# Patient Record
Sex: Male | Born: 1945 | Race: White | Hispanic: No | Marital: Married | Smoking: Former smoker
Health system: Southern US, Community
[De-identification: ages and names within clinical notes are randomized; demographics above are authoritative.]

## PROBLEM LIST (undated history)

## (undated) DIAGNOSIS — R42 Dizziness and giddiness: Secondary | ICD-10-CM

## (undated) DIAGNOSIS — G4730 Sleep apnea, unspecified: Secondary | ICD-10-CM

## (undated) DIAGNOSIS — K51919 Ulcerative colitis, unspecified with unspecified complications: Secondary | ICD-10-CM

## (undated) DIAGNOSIS — K529 Noninfective gastroenteritis and colitis, unspecified: Secondary | ICD-10-CM

## (undated) DIAGNOSIS — H9190 Unspecified hearing loss, unspecified ear: Secondary | ICD-10-CM

## (undated) DIAGNOSIS — K51019 Ulcerative (chronic) pancolitis with unspecified complications: Secondary | ICD-10-CM

## (undated) DIAGNOSIS — M1990 Unspecified osteoarthritis, unspecified site: Secondary | ICD-10-CM

## (undated) DIAGNOSIS — L409 Psoriasis, unspecified: Secondary | ICD-10-CM

## (undated) DIAGNOSIS — I639 Cerebral infarction, unspecified: Secondary | ICD-10-CM

## (undated) HISTORY — PX: TONSILLECTOMY: SUR1361

## (undated) HISTORY — PX: UVULOPALATOPHARYNGOPLASTY: SHX827

---

## 2015-04-27 ENCOUNTER — Telehealth: Payer: Self-pay | Admitting: Family Medicine

## 2015-04-27 NOTE — Telephone Encounter (Signed)
[place], wanted to know if will take him back as a pt

## 2015-06-03 ENCOUNTER — Telehealth: Payer: Self-pay | Admitting: Family Medicine

## 2015-06-03 NOTE — Telephone Encounter (Signed)
See below

## 2015-06-03 NOTE — Telephone Encounter (Signed)
Pt stated that he was a pt of Dr. Alben Spittle years ago and he moved away and has now moved back. Pt would like to be reestablished with Dr. Rosanna Randy and would like to come in for a CPE. Insurance: Nurse, children's. Medical conditions: Arthritis & Colitis. Current Medications: Lialda & Methotrexate. Pt was seeing Dr. Ascencion Dike while pt was living in [place] last CPE was March 2015. Can you reestablish? If yes, when can I get pt on the schedule and does pt need a new pt appt then a CPE? Thanks TNP

## 2015-06-06 NOTE — Telephone Encounter (Signed)
Please call patient to schedule the appointment

## 2015-06-06 NOTE — Telephone Encounter (Signed)
I can see him next week to get reestablished,will sign for old records then and arrange Wellness.

## 2015-06-07 NOTE — Telephone Encounter (Signed)
I called pt to offer 3:00pm on 06/15/15. Pt stated that once he gets back to his office he will check his schedule and call back. Thanks TNP

## 2015-06-07 NOTE — Telephone Encounter (Signed)
Pt called back and stated that he was very glad that Dr. Rosanna Randy was going to work him in but he was able to get an appt somewhere else. Thanks TNP

## 2015-07-06 DIAGNOSIS — E7800 Pure hypercholesterolemia, unspecified: Secondary | ICD-10-CM | POA: Insufficient documentation

## 2015-07-06 DIAGNOSIS — M0579 Rheumatoid arthritis with rheumatoid factor of multiple sites without organ or systems involvement: Secondary | ICD-10-CM | POA: Insufficient documentation

## 2015-12-28 DIAGNOSIS — H2513 Age-related nuclear cataract, bilateral: Secondary | ICD-10-CM | POA: Diagnosis not present

## 2015-12-28 DIAGNOSIS — H5501 Congenital nystagmus: Secondary | ICD-10-CM | POA: Diagnosis not present

## 2016-01-16 DIAGNOSIS — M0579 Rheumatoid arthritis with rheumatoid factor of multiple sites without organ or systems involvement: Secondary | ICD-10-CM | POA: Diagnosis not present

## 2016-01-23 DIAGNOSIS — M810 Age-related osteoporosis without current pathological fracture: Secondary | ICD-10-CM | POA: Diagnosis not present

## 2016-01-23 DIAGNOSIS — K5190 Ulcerative colitis, unspecified, without complications: Secondary | ICD-10-CM | POA: Diagnosis not present

## 2016-01-23 DIAGNOSIS — Z79899 Other long term (current) drug therapy: Secondary | ICD-10-CM | POA: Diagnosis not present

## 2016-01-23 DIAGNOSIS — M0579 Rheumatoid arthritis with rheumatoid factor of multiple sites without organ or systems involvement: Secondary | ICD-10-CM | POA: Diagnosis not present

## 2016-01-23 DIAGNOSIS — L409 Psoriasis, unspecified: Secondary | ICD-10-CM | POA: Diagnosis not present

## 2016-02-01 DIAGNOSIS — M0579 Rheumatoid arthritis with rheumatoid factor of multiple sites without organ or systems involvement: Secondary | ICD-10-CM | POA: Diagnosis not present

## 2016-02-29 DIAGNOSIS — M0579 Rheumatoid arthritis with rheumatoid factor of multiple sites without organ or systems involvement: Secondary | ICD-10-CM | POA: Diagnosis not present

## 2016-02-29 DIAGNOSIS — Z79899 Other long term (current) drug therapy: Secondary | ICD-10-CM | POA: Diagnosis not present

## 2016-03-12 DIAGNOSIS — L409 Psoriasis, unspecified: Secondary | ICD-10-CM | POA: Diagnosis not present

## 2016-03-12 DIAGNOSIS — M0579 Rheumatoid arthritis with rheumatoid factor of multiple sites without organ or systems involvement: Secondary | ICD-10-CM | POA: Diagnosis not present

## 2016-03-12 DIAGNOSIS — K5190 Ulcerative colitis, unspecified, without complications: Secondary | ICD-10-CM | POA: Diagnosis not present

## 2016-03-12 DIAGNOSIS — Z79899 Other long term (current) drug therapy: Secondary | ICD-10-CM | POA: Diagnosis not present

## 2016-03-26 DIAGNOSIS — D225 Melanocytic nevi of trunk: Secondary | ICD-10-CM | POA: Diagnosis not present

## 2016-03-26 DIAGNOSIS — D2272 Melanocytic nevi of left lower limb, including hip: Secondary | ICD-10-CM | POA: Diagnosis not present

## 2016-03-26 DIAGNOSIS — D2271 Melanocytic nevi of right lower limb, including hip: Secondary | ICD-10-CM | POA: Diagnosis not present

## 2016-03-26 DIAGNOSIS — X32XXXA Exposure to sunlight, initial encounter: Secondary | ICD-10-CM | POA: Diagnosis not present

## 2016-03-26 DIAGNOSIS — D2261 Melanocytic nevi of right upper limb, including shoulder: Secondary | ICD-10-CM | POA: Diagnosis not present

## 2016-03-26 DIAGNOSIS — L570 Actinic keratosis: Secondary | ICD-10-CM | POA: Diagnosis not present

## 2016-06-05 DIAGNOSIS — K5130 Ulcerative (chronic) rectosigmoiditis without complications: Secondary | ICD-10-CM | POA: Insufficient documentation

## 2016-06-13 DIAGNOSIS — Z79899 Other long term (current) drug therapy: Secondary | ICD-10-CM | POA: Diagnosis not present

## 2016-06-13 DIAGNOSIS — M0579 Rheumatoid arthritis with rheumatoid factor of multiple sites without organ or systems involvement: Secondary | ICD-10-CM | POA: Diagnosis not present

## 2016-06-30 DIAGNOSIS — K5732 Diverticulitis of large intestine without perforation or abscess without bleeding: Secondary | ICD-10-CM | POA: Diagnosis not present

## 2016-07-19 DIAGNOSIS — E7800 Pure hypercholesterolemia, unspecified: Secondary | ICD-10-CM | POA: Diagnosis not present

## 2016-07-19 DIAGNOSIS — K5130 Ulcerative (chronic) rectosigmoiditis without complications: Secondary | ICD-10-CM | POA: Diagnosis not present

## 2016-07-19 DIAGNOSIS — Z0000 Encounter for general adult medical examination without abnormal findings: Secondary | ICD-10-CM | POA: Insufficient documentation

## 2016-07-19 DIAGNOSIS — M0579 Rheumatoid arthritis with rheumatoid factor of multiple sites without organ or systems involvement: Secondary | ICD-10-CM | POA: Diagnosis not present

## 2016-07-19 DIAGNOSIS — Z125 Encounter for screening for malignant neoplasm of prostate: Secondary | ICD-10-CM | POA: Diagnosis not present

## 2016-07-23 DIAGNOSIS — M65332 Trigger finger, left middle finger: Secondary | ICD-10-CM | POA: Diagnosis not present

## 2016-08-14 DIAGNOSIS — K5190 Ulcerative colitis, unspecified, without complications: Secondary | ICD-10-CM | POA: Diagnosis not present

## 2016-08-14 DIAGNOSIS — M65332 Trigger finger, left middle finger: Secondary | ICD-10-CM | POA: Diagnosis not present

## 2016-08-14 DIAGNOSIS — M0579 Rheumatoid arthritis with rheumatoid factor of multiple sites without organ or systems involvement: Secondary | ICD-10-CM | POA: Diagnosis not present

## 2016-09-06 DIAGNOSIS — Z0000 Encounter for general adult medical examination without abnormal findings: Secondary | ICD-10-CM | POA: Diagnosis not present

## 2016-09-06 DIAGNOSIS — M0579 Rheumatoid arthritis with rheumatoid factor of multiple sites without organ or systems involvement: Secondary | ICD-10-CM | POA: Diagnosis not present

## 2016-09-06 DIAGNOSIS — Z125 Encounter for screening for malignant neoplasm of prostate: Secondary | ICD-10-CM | POA: Diagnosis not present

## 2016-09-06 DIAGNOSIS — E7800 Pure hypercholesterolemia, unspecified: Secondary | ICD-10-CM | POA: Diagnosis not present

## 2016-09-06 DIAGNOSIS — M65332 Trigger finger, left middle finger: Secondary | ICD-10-CM | POA: Diagnosis not present

## 2016-09-06 DIAGNOSIS — K5190 Ulcerative colitis, unspecified, without complications: Secondary | ICD-10-CM | POA: Diagnosis not present

## 2016-11-08 DIAGNOSIS — D485 Neoplasm of uncertain behavior of skin: Secondary | ICD-10-CM | POA: Diagnosis not present

## 2016-11-08 DIAGNOSIS — X32XXXA Exposure to sunlight, initial encounter: Secondary | ICD-10-CM | POA: Diagnosis not present

## 2016-11-08 DIAGNOSIS — L821 Other seborrheic keratosis: Secondary | ICD-10-CM | POA: Diagnosis not present

## 2016-11-08 DIAGNOSIS — L570 Actinic keratosis: Secondary | ICD-10-CM | POA: Diagnosis not present

## 2016-11-23 DIAGNOSIS — J189 Pneumonia, unspecified organism: Secondary | ICD-10-CM | POA: Diagnosis not present

## 2016-11-30 DIAGNOSIS — J209 Acute bronchitis, unspecified: Secondary | ICD-10-CM | POA: Diagnosis not present

## 2016-11-30 DIAGNOSIS — J189 Pneumonia, unspecified organism: Secondary | ICD-10-CM | POA: Diagnosis not present

## 2016-12-04 DIAGNOSIS — R000 Tachycardia, unspecified: Secondary | ICD-10-CM | POA: Diagnosis not present

## 2016-12-04 DIAGNOSIS — J159 Unspecified bacterial pneumonia: Secondary | ICD-10-CM | POA: Diagnosis not present

## 2016-12-04 DIAGNOSIS — J189 Pneumonia, unspecified organism: Secondary | ICD-10-CM | POA: Diagnosis not present

## 2016-12-06 DIAGNOSIS — J449 Chronic obstructive pulmonary disease, unspecified: Secondary | ICD-10-CM | POA: Diagnosis not present

## 2016-12-06 DIAGNOSIS — J440 Chronic obstructive pulmonary disease with acute lower respiratory infection: Secondary | ICD-10-CM | POA: Diagnosis not present

## 2016-12-06 DIAGNOSIS — J410 Simple chronic bronchitis: Secondary | ICD-10-CM | POA: Insufficient documentation

## 2016-12-06 DIAGNOSIS — J189 Pneumonia, unspecified organism: Secondary | ICD-10-CM | POA: Diagnosis not present

## 2016-12-14 DIAGNOSIS — M65332 Trigger finger, left middle finger: Secondary | ICD-10-CM | POA: Diagnosis not present

## 2016-12-14 DIAGNOSIS — K5190 Ulcerative colitis, unspecified, without complications: Secondary | ICD-10-CM | POA: Diagnosis not present

## 2016-12-14 DIAGNOSIS — M0579 Rheumatoid arthritis with rheumatoid factor of multiple sites without organ or systems involvement: Secondary | ICD-10-CM | POA: Diagnosis not present

## 2016-12-24 ENCOUNTER — Other Ambulatory Visit: Payer: Self-pay | Admitting: Specialist

## 2016-12-24 DIAGNOSIS — R942 Abnormal results of pulmonary function studies: Secondary | ICD-10-CM | POA: Diagnosis not present

## 2016-12-24 DIAGNOSIS — R0602 Shortness of breath: Secondary | ICD-10-CM | POA: Diagnosis not present

## 2016-12-24 DIAGNOSIS — J449 Chronic obstructive pulmonary disease, unspecified: Secondary | ICD-10-CM | POA: Diagnosis not present

## 2017-01-21 ENCOUNTER — Ambulatory Visit
Admission: RE | Admit: 2017-01-21 | Discharge: 2017-01-21 | Disposition: A | Discharge status: Home / Self Care | Payer: PPO | Source: Ambulatory Visit | Attending: Specialist | Admitting: Specialist

## 2017-01-21 DIAGNOSIS — R0602 Shortness of breath: Secondary | ICD-10-CM | POA: Insufficient documentation

## 2017-01-21 DIAGNOSIS — J470 Bronchiectasis with acute lower respiratory infection: Secondary | ICD-10-CM | POA: Diagnosis not present

## 2017-01-21 DIAGNOSIS — R918 Other nonspecific abnormal finding of lung field: Secondary | ICD-10-CM | POA: Insufficient documentation

## 2017-01-21 DIAGNOSIS — R942 Abnormal results of pulmonary function studies: Secondary | ICD-10-CM | POA: Diagnosis not present

## 2017-01-21 DIAGNOSIS — J189 Pneumonia, unspecified organism: Secondary | ICD-10-CM | POA: Diagnosis not present

## 2017-02-04 DIAGNOSIS — K5190 Ulcerative colitis, unspecified, without complications: Secondary | ICD-10-CM | POA: Diagnosis not present

## 2017-02-04 DIAGNOSIS — Z79899 Other long term (current) drug therapy: Secondary | ICD-10-CM | POA: Diagnosis not present

## 2017-02-04 DIAGNOSIS — M0579 Rheumatoid arthritis with rheumatoid factor of multiple sites without organ or systems involvement: Secondary | ICD-10-CM | POA: Diagnosis not present

## 2017-02-04 DIAGNOSIS — M810 Age-related osteoporosis without current pathological fracture: Secondary | ICD-10-CM | POA: Diagnosis not present

## 2017-02-04 DIAGNOSIS — L409 Psoriasis, unspecified: Secondary | ICD-10-CM | POA: Diagnosis not present

## 2017-02-27 DIAGNOSIS — M65342 Trigger finger, left ring finger: Secondary | ICD-10-CM | POA: Diagnosis not present

## 2017-05-07 DIAGNOSIS — Z79899 Other long term (current) drug therapy: Secondary | ICD-10-CM | POA: Diagnosis not present

## 2017-05-07 DIAGNOSIS — M0579 Rheumatoid arthritis with rheumatoid factor of multiple sites without organ or systems involvement: Secondary | ICD-10-CM | POA: Diagnosis not present

## 2017-05-14 DIAGNOSIS — K5190 Ulcerative colitis, unspecified, without complications: Secondary | ICD-10-CM | POA: Diagnosis not present

## 2017-05-14 DIAGNOSIS — Z79899 Other long term (current) drug therapy: Secondary | ICD-10-CM | POA: Diagnosis not present

## 2017-05-14 DIAGNOSIS — M65342 Trigger finger, left ring finger: Secondary | ICD-10-CM | POA: Diagnosis not present

## 2017-05-14 DIAGNOSIS — M0579 Rheumatoid arthritis with rheumatoid factor of multiple sites without organ or systems involvement: Secondary | ICD-10-CM | POA: Diagnosis not present

## 2017-06-14 DIAGNOSIS — K5130 Ulcerative (chronic) rectosigmoiditis without complications: Secondary | ICD-10-CM | POA: Diagnosis not present

## 2017-06-17 DIAGNOSIS — M0579 Rheumatoid arthritis with rheumatoid factor of multiple sites without organ or systems involvement: Secondary | ICD-10-CM | POA: Diagnosis not present

## 2017-07-01 DIAGNOSIS — M0579 Rheumatoid arthritis with rheumatoid factor of multiple sites without organ or systems involvement: Secondary | ICD-10-CM | POA: Diagnosis not present

## 2017-07-09 DIAGNOSIS — L821 Other seborrheic keratosis: Secondary | ICD-10-CM | POA: Diagnosis not present

## 2017-07-09 DIAGNOSIS — X32XXXA Exposure to sunlight, initial encounter: Secondary | ICD-10-CM | POA: Diagnosis not present

## 2017-07-09 DIAGNOSIS — L570 Actinic keratosis: Secondary | ICD-10-CM | POA: Diagnosis not present

## 2017-07-30 DIAGNOSIS — M0579 Rheumatoid arthritis with rheumatoid factor of multiple sites without organ or systems involvement: Secondary | ICD-10-CM | POA: Diagnosis not present

## 2017-08-13 DIAGNOSIS — R740 Nonspecific elevation of levels of transaminase and lactic acid dehydrogenase [LDH]: Secondary | ICD-10-CM | POA: Diagnosis not present

## 2017-08-13 DIAGNOSIS — Z125 Encounter for screening for malignant neoplasm of prostate: Secondary | ICD-10-CM | POA: Diagnosis not present

## 2017-08-13 DIAGNOSIS — Z131 Encounter for screening for diabetes mellitus: Secondary | ICD-10-CM | POA: Diagnosis not present

## 2017-08-20 DIAGNOSIS — K5130 Ulcerative (chronic) rectosigmoiditis without complications: Secondary | ICD-10-CM | POA: Diagnosis not present

## 2017-08-20 DIAGNOSIS — J410 Simple chronic bronchitis: Secondary | ICD-10-CM | POA: Diagnosis not present

## 2017-08-20 DIAGNOSIS — M0579 Rheumatoid arthritis with rheumatoid factor of multiple sites without organ or systems involvement: Secondary | ICD-10-CM | POA: Diagnosis not present

## 2017-08-20 DIAGNOSIS — Z0000 Encounter for general adult medical examination without abnormal findings: Secondary | ICD-10-CM | POA: Diagnosis not present

## 2017-08-20 DIAGNOSIS — E7800 Pure hypercholesterolemia, unspecified: Secondary | ICD-10-CM | POA: Diagnosis not present

## 2017-09-05 DIAGNOSIS — B029 Zoster without complications: Secondary | ICD-10-CM | POA: Diagnosis not present

## 2017-09-05 DIAGNOSIS — M0579 Rheumatoid arthritis with rheumatoid factor of multiple sites without organ or systems involvement: Secondary | ICD-10-CM | POA: Diagnosis not present

## 2017-10-14 DIAGNOSIS — K5190 Ulcerative colitis, unspecified, without complications: Secondary | ICD-10-CM | POA: Diagnosis not present

## 2017-10-14 DIAGNOSIS — M0579 Rheumatoid arthritis with rheumatoid factor of multiple sites without organ or systems involvement: Secondary | ICD-10-CM | POA: Diagnosis not present

## 2017-10-14 DIAGNOSIS — M810 Age-related osteoporosis without current pathological fracture: Secondary | ICD-10-CM | POA: Diagnosis not present

## 2017-11-05 DIAGNOSIS — M810 Age-related osteoporosis without current pathological fracture: Secondary | ICD-10-CM | POA: Diagnosis not present

## 2017-12-09 DIAGNOSIS — M0579 Rheumatoid arthritis with rheumatoid factor of multiple sites without organ or systems involvement: Secondary | ICD-10-CM | POA: Diagnosis not present

## 2017-12-10 DIAGNOSIS — D485 Neoplasm of uncertain behavior of skin: Secondary | ICD-10-CM | POA: Diagnosis not present

## 2017-12-10 DIAGNOSIS — D0472 Carcinoma in situ of skin of left lower limb, including hip: Secondary | ICD-10-CM | POA: Diagnosis not present

## 2017-12-10 DIAGNOSIS — D0471 Carcinoma in situ of skin of right lower limb, including hip: Secondary | ICD-10-CM | POA: Diagnosis not present

## 2017-12-10 DIAGNOSIS — X32XXXA Exposure to sunlight, initial encounter: Secondary | ICD-10-CM | POA: Diagnosis not present

## 2017-12-10 DIAGNOSIS — L821 Other seborrheic keratosis: Secondary | ICD-10-CM | POA: Diagnosis not present

## 2017-12-10 DIAGNOSIS — Z85828 Personal history of other malignant neoplasm of skin: Secondary | ICD-10-CM | POA: Diagnosis not present

## 2017-12-10 DIAGNOSIS — L570 Actinic keratosis: Secondary | ICD-10-CM | POA: Diagnosis not present

## 2017-12-10 DIAGNOSIS — Z08 Encounter for follow-up examination after completed treatment for malignant neoplasm: Secondary | ICD-10-CM | POA: Diagnosis not present

## 2017-12-19 ENCOUNTER — Encounter: Payer: Self-pay | Admitting: *Deleted

## 2017-12-20 ENCOUNTER — Encounter
Admission: RE | Disposition: A | Discharge status: Home / Self Care | Payer: Self-pay | Source: Ambulatory Visit | Attending: Unknown Physician Specialty

## 2017-12-20 ENCOUNTER — Ambulatory Visit: Payer: PPO | Admitting: Anesthesiology

## 2017-12-20 ENCOUNTER — Ambulatory Visit
Admission: RE | Admit: 2017-12-20 | Discharge: 2017-12-20 | Disposition: A | Discharge status: Home / Self Care | Payer: PPO | Source: Ambulatory Visit | Attending: Unknown Physician Specialty | Admitting: Unknown Physician Specialty

## 2017-12-20 DIAGNOSIS — K5730 Diverticulosis of large intestine without perforation or abscess without bleeding: Secondary | ICD-10-CM | POA: Diagnosis not present

## 2017-12-20 DIAGNOSIS — K648 Other hemorrhoids: Secondary | ICD-10-CM | POA: Diagnosis not present

## 2017-12-20 DIAGNOSIS — K529 Noninfective gastroenteritis and colitis, unspecified: Secondary | ICD-10-CM | POA: Diagnosis not present

## 2017-12-20 DIAGNOSIS — K5190 Ulcerative colitis, unspecified, without complications: Secondary | ICD-10-CM | POA: Diagnosis not present

## 2017-12-20 DIAGNOSIS — Z87891 Personal history of nicotine dependence: Secondary | ICD-10-CM | POA: Insufficient documentation

## 2017-12-20 DIAGNOSIS — Z09 Encounter for follow-up examination after completed treatment for conditions other than malignant neoplasm: Secondary | ICD-10-CM | POA: Insufficient documentation

## 2017-12-20 DIAGNOSIS — M1990 Unspecified osteoarthritis, unspecified site: Secondary | ICD-10-CM | POA: Insufficient documentation

## 2017-12-20 DIAGNOSIS — K5790 Diverticulosis of intestine, part unspecified, without perforation or abscess without bleeding: Secondary | ICD-10-CM | POA: Diagnosis not present

## 2017-12-20 DIAGNOSIS — K5130 Ulcerative (chronic) rectosigmoiditis without complications: Secondary | ICD-10-CM | POA: Diagnosis not present

## 2017-12-20 DIAGNOSIS — Z79899 Other long term (current) drug therapy: Secondary | ICD-10-CM | POA: Insufficient documentation

## 2017-12-20 DIAGNOSIS — K5100 Ulcerative (chronic) pancolitis without complications: Secondary | ICD-10-CM | POA: Diagnosis not present

## 2017-12-20 DIAGNOSIS — L409 Psoriasis, unspecified: Secondary | ICD-10-CM | POA: Insufficient documentation

## 2017-12-20 HISTORY — PX: COLONOSCOPY WITH PROPOFOL: SHX5780

## 2017-12-20 HISTORY — DX: Psoriasis, unspecified: L40.9

## 2017-12-20 HISTORY — DX: Ulcerative colitis, unspecified with unspecified complications: K51.919

## 2017-12-20 HISTORY — DX: Noninfective gastroenteritis and colitis, unspecified: K52.9

## 2017-12-20 HISTORY — DX: Ulcerative (chronic) pancolitis with unspecified complications: K51.019

## 2017-12-20 HISTORY — DX: Unspecified osteoarthritis, unspecified site: M19.90

## 2017-12-20 SURGERY — COLONOSCOPY WITH PROPOFOL
Anesthesia: General

## 2017-12-20 MED ORDER — PROPOFOL 500 MG/50ML IV EMUL
INTRAVENOUS | Status: DC | PRN
  Administered 2017-12-20: 180 ug/kg/min via INTRAVENOUS

## 2017-12-20 MED ORDER — PROPOFOL 10 MG/ML IV BOLUS
INTRAVENOUS | Status: AC
  Filled 2017-12-20: qty 20

## 2017-12-20 MED ORDER — PROPOFOL 10 MG/ML IV BOLUS
INTRAVENOUS | Status: DC | PRN
  Administered 2017-12-20: 5 mg via INTRAVENOUS
  Administered 2017-12-20: 20 mg via INTRAVENOUS
  Administered 2017-12-20: 10 mg via INTRAVENOUS
  Administered 2017-12-20: 30 mg via INTRAVENOUS
  Administered 2017-12-20: 5 mg via INTRAVENOUS

## 2017-12-20 MED ORDER — MIDAZOLAM HCL 2 MG/2ML IJ SOLN
INTRAMUSCULAR | Status: AC
  Filled 2017-12-20: qty 2

## 2017-12-20 MED ORDER — FENTANYL CITRATE (PF) 100 MCG/2ML IJ SOLN: INTRAMUSCULAR | Status: DC | PRN

## 2017-12-20 MED ORDER — LIDOCAINE HCL (PF) 1 % IJ SOLN
INTRAMUSCULAR | Status: AC
  Administered 2017-12-20: 0.3 mL via INTRADERMAL
  Filled 2017-12-20: qty 2

## 2017-12-20 MED ORDER — FENTANYL CITRATE (PF) 100 MCG/2ML IJ SOLN
INTRAMUSCULAR | Status: AC
  Filled 2017-12-20: qty 2

## 2017-12-20 MED ORDER — MIDAZOLAM HCL 2 MG/2ML IJ SOLN
INTRAMUSCULAR | Status: DC | PRN
  Administered 2017-12-20: 0.5 mg via INTRAVENOUS

## 2017-12-20 MED ORDER — LIDOCAINE HCL (PF) 2 % IJ SOLN
INTRAMUSCULAR | Status: AC
  Filled 2017-12-20: qty 10

## 2017-12-20 MED ORDER — FENTANYL CITRATE (PF) 100 MCG/2ML IJ SOLN
INTRAMUSCULAR | Status: DC | PRN
  Administered 2017-12-20 (×2): 25 ug via INTRAVENOUS

## 2017-12-20 MED ORDER — LIDOCAINE HCL (PF) 1 % IJ SOLN
2.0000 mL | Freq: Once | INTRAMUSCULAR | Status: AC
  Administered 2017-12-20: 0.3 mL via INTRADERMAL

## 2017-12-20 MED ORDER — SODIUM CHLORIDE 0.9 % IV SOLN
INTRAVENOUS | Status: DC
  Administered 2017-12-20: 1000 mL via INTRAVENOUS
  Administered 2017-12-20: 12:00:00 via INTRAVENOUS

## 2017-12-20 MED ORDER — PHENYLEPHRINE HCL 10 MG/ML IJ SOLN
INTRAMUSCULAR | Status: DC | PRN
  Administered 2017-12-20: 100 ug via INTRAVENOUS
  Administered 2017-12-20: 150 ug via INTRAVENOUS
  Administered 2017-12-20: 100 ug via INTRAVENOUS

## 2017-12-20 NOTE — Op Note (Signed)
[facility] Patient Name: Michael Benjamin Procedure Date: 12/20/2017 12:23 PM MRN: [id number] Account #: [id number] Date of Birth: 06/07/1946 Admit Type: Outpatient Age: 72 Room: [facility] ENDO ROOM 3 Gender: Male Note Status: Finalized Procedure:            Colonoscopy Indications:          High risk colon cancer surveillance: Ulcerative left                        sided colitis Providers:            Manya Silvas, MD Referring MD:         Ocie Cornfield. Ouida Sills MD, MD (Referring MD) Medicines:            Propofol per Anesthesia Complications:        No immediate complications. Procedure:            Pre-Anesthesia Assessment:                       - After reviewing the risks and benefits, the patient                        was deemed in satisfactory condition to undergo the                        procedure.                       After obtaining informed consent, the colonoscope was                        passed under direct vision. Throughout the procedure,                        the patient's blood pressure, pulse, and oxygen                        saturations were monitored continuously. The                        Colonoscope was introduced through the anus and                        advanced to the the cecum, identified by appendiceal                        orifice and ileocecal valve. The colonoscopy was                        performed without difficulty. The patient tolerated the                        procedure well. The quality of the bowel preparation                        was excellent. Findings:      Multiple small-mouthed diverticula were found in the sigmoid colon and       descending colon. Biopsies were taken with a cold forceps for histology.       Done at 40,30, and 20cm from mucosa that looks pretty normal. Will await  biopsy reports. Patient may need medication adjustment. Impression:           - Diverticulosis in the sigmoid  colon and in the                        descending colon. Biopsied. Recommendation:       - Await pathology results. Continue current meds for                        now. Manya Silvas, MD 12/20/2017 1:05:01 PM This report has been signed electronically. Number of Addenda: 0 Note Initiated On: 12/20/2017 12:23 PM Scope Withdrawal Time: 0 hours 13 minutes 51 seconds  Total Procedure Duration: 0 hours 23 minutes 45 seconds       [facility]

## 2017-12-20 NOTE — Anesthesia Post-op Follow-up Note (Signed)
Anesthesia QCDR form completed.        

## 2017-12-20 NOTE — Anesthesia Preprocedure Evaluation (Signed)
Anesthesia Evaluation  Patient identified by MRN, date of birth, ID band Patient awake    Reviewed: Allergy & Precautions, NPO status , Patient's Chart, lab work & pertinent test results  History of Anesthesia Complications Negative for: history of anesthetic complications  Airway Mallampati: II       Dental   Pulmonary neg sleep apnea, neg COPD, former smoker,           Cardiovascular (-) hypertension(-) Past MI and (-) CHF (-) dysrhythmias (-) Valvular Problems/Murmurs     Neuro/Psych neg Seizures TIA (affected apeech, resolved)   GI/Hepatic Neg liver ROS, PUD,   Endo/Other  neg diabetes  Renal/GU negative Renal ROS     Musculoskeletal   Abdominal   Peds  Hematology   Anesthesia Other Findings   Reproductive/Obstetrics                             Anesthesia Physical Anesthesia Plan  ASA: II  Anesthesia Plan: General   Post-op Pain Management:    Induction: Intravenous  PONV Risk Score and Plan: 2 and TIVA and Propofol infusion  Airway Management Planned: Nasal Cannula  Additional Equipment:   Intra-op Plan:   Post-operative Plan:   Informed Consent: I have reviewed the patients History and Physical, chart, labs and discussed the procedure including the risks, benefits and alternatives for the proposed anesthesia with the patient or authorized representative who has indicated his/her understanding and acceptance.     Plan Discussed with:   Anesthesia Plan Comments:         Anesthesia Quick Evaluation

## 2017-12-20 NOTE — Anesthesia Procedure Notes (Signed)
Date/Time: 12/20/2017 12:30 PM Performed by: Allean Found, CRNA Pre-anesthesia Checklist: Patient identified, Emergency Drugs available, Suction available, Patient being monitored and Timeout performed Patient Re-evaluated:Patient Re-evaluated prior to induction Oxygen Delivery Method: Nasal cannula Placement Confirmation: positive ETCO2 Dental Injury: Teeth and Oropharynx as per pre-operative assessment

## 2017-12-20 NOTE — Anesthesia Postprocedure Evaluation (Signed)
Anesthesia Post Note  Patient: Michael Benjamin  Procedure(s) Performed: COLONOSCOPY WITH PROPOFOL (N/A )  Patient location during evaluation: Endoscopy Anesthesia Type: General Level of consciousness: awake and alert Pain management: pain level controlled Vital Signs Assessment: post-procedure vital signs reviewed and stable Respiratory status: spontaneous breathing and respiratory function stable Cardiovascular status: stable Anesthetic complications: no     Last Vitals:  Vitals:   12/20/17 1310 12/20/17 1318  BP:  95/74  Resp:    Temp: (!) 35.6 C   SpO2:      Last Pain:  Vitals:   12/20/17 1310  TempSrc: Tympanic                 Ameliarose Shark K

## 2017-12-20 NOTE — Transfer of Care (Addendum)
Immediate Anesthesia Transfer of Care Note  Patient: Michael Benjamin  Procedure(s) Performed: COLONOSCOPY WITH PROPOFOL (N/A )  Patient Location: PACU  Anesthesia Type:General  Level of Consciousness: awake  Airway & Oxygen Therapy: Patient Spontanous Breathing and Patient connected to nasal cannula oxygen  Post-op Assessment: Report given to RN and Post -op Vital signs reviewed and stable  Post vital signs: Reviewed and stable  Last Vitals:  Vitals:   12/20/17 1119  BP: 111/70  Resp: 17  Temp: 36.4 C  SpO2: 100%    Last Pain:  Vitals:   12/20/17 1119  TempSrc: Tympanic         Complications: No apparent anesthesia complications

## 2017-12-23 ENCOUNTER — Encounter: Payer: Self-pay | Admitting: Unknown Physician Specialty

## 2017-12-23 LAB — SURGICAL PATHOLOGY

## 2018-01-10 ENCOUNTER — Encounter: Payer: Self-pay | Admitting: Unknown Physician Specialty

## 2018-01-20 ENCOUNTER — Encounter: Payer: Self-pay | Admitting: Unknown Physician Specialty

## 2018-01-20 NOTE — H&P (Signed)
Primary Care Physician:  Kirk Ruths, MD Primary Gastroenterologist:  Dr. Vira Agar  Pre-Procedure History & Physical: HPI:  Michael Benjamin is a 72 y.o. male is here for an colonoscopy.for left sided UC.   Past Medical History:  Diagnosis Date  . Arthritis    methotrexate, prednisone, positive rheumatoid factor and anti CCP antibiodies  . Chronic ulcerative colitis with complication ([place])   . Chronic ulcerative pancolitis with complication ([place])   . Colitis   . Psoriasis     Past Surgical History:  Procedure Laterality Date  . COLONOSCOPY WITH PROPOFOL N/A 12/20/2017   Procedure: COLONOSCOPY WITH PROPOFOL;  Surgeon: Manya Silvas, MD;  Location: [facility] ENDOSCOPY;  Service: Endoscopy;  Laterality: N/A;  . UVULOPALATOPHARYNGOPLASTY      Prior to Admission medications   Medication Sig Start Date End Date Taking? Authorizing Provider  ACIDOPHILUS LACTOBACILLUS PO Take by mouth.   Yes [provider]  alendronate (FOSAMAX) 70 MG tablet Take 70 mg by mouth once a week. Take with a full glass of water on an empty stomach.   Yes [provider]  calcium carbonate (TUMS - DOSED IN MG ELEMENTAL CALCIUM) 500 MG chewable tablet Chew 1 tablet by mouth as needed for indigestion or heartburn.   Yes [provider]  Cholecalciferol 2000 units TABS Take 2,000 Units by mouth.   Yes [provider]  folic acid (FOLVITE) 1 MG tablet Take 1 mg by mouth daily.   Yes [provider]  magnesium oxide (MAG-OX) 400 MG tablet Take 400 mg by mouth daily.   Yes [provider]  mesalamine (LIALDA) 1.2 g EC tablet Take 2.4 g by mouth daily with breakfast.   Yes [provider]  Methotrexate, PF, 25 MG/0.5ML SOAJ Inject into the skin once a week.   Yes [provider]  vitamin C (ASCORBIC ACID) 500 MG tablet Take 500 mg by mouth daily.   Yes [provider]    Allergies as of 06/28/2017  . (Not on File)    No family  history on file.  Social History   Socioeconomic History  . Marital status: Married    Spouse name: Not on file  . Number of children: Not on file  . Years of education: Not on file  . Highest education level: Not on file  Social Needs  . Financial resource strain: Not on file  . Food insecurity - worry: Not on file  . Food insecurity - inability: Not on file  . Transportation needs - medical: Not on file  . Transportation needs - non-medical: Not on file  Occupational History  . Not on file  Tobacco Use  . Smoking status: Not on file  Substance and Sexual Activity  . Alcohol use: Not on file  . Drug use: Not on file  . Sexual activity: Not on file  Other Topics Concern  . Not on file  Social History Narrative  . Not on file    Review of Systems: See HPI, otherwise negative ROS  Physical Exam: BP 99/71   Temp (!) 96 F (35.6 C) (Tympanic)   Resp 17   Ht 5' 8"  (1.727 m)   Wt 69.4 kg (153 lb)   SpO2 100%   BMI 23.26 kg/m  General:   Alert,  pleasant and cooperative in NAD Head:  Normocephalic and atraumatic. Neck:  Supple; no masses or thyromegaly. Lungs:  Clear throughout to auscultation.    Heart:  Regular rate and  rhythm. Abdomen:  Soft, nontender and nondistended. Normal bowel sounds, without guarding, and without rebound.   Neurologic:  Alert and  oriented x4;  grossly normal neurologically.  Impression/Plan: Michael Benjamin is here for an colonoscopy to be performed for left sided ulcerative colitis.  Risks, benefits, limitations, and alternatives regarding  colonoscopy have been reviewed with the patient.  Questions have been answered.  All parties agreeable.   Gaylyn Cheers, MD  01/20/2018, 11:43 AM

## 2018-02-03 DIAGNOSIS — M810 Age-related osteoporosis without current pathological fracture: Secondary | ICD-10-CM | POA: Diagnosis not present

## 2018-02-03 DIAGNOSIS — M0579 Rheumatoid arthritis with rheumatoid factor of multiple sites without organ or systems involvement: Secondary | ICD-10-CM | POA: Diagnosis not present

## 2018-02-03 DIAGNOSIS — K5190 Ulcerative colitis, unspecified, without complications: Secondary | ICD-10-CM | POA: Diagnosis not present

## 2018-03-31 DIAGNOSIS — M0579 Rheumatoid arthritis with rheumatoid factor of multiple sites without organ or systems involvement: Secondary | ICD-10-CM | POA: Diagnosis not present

## 2018-04-08 DIAGNOSIS — H2512 Age-related nuclear cataract, left eye: Secondary | ICD-10-CM | POA: Diagnosis not present

## 2018-04-17 DIAGNOSIS — L400 Psoriasis vulgaris: Secondary | ICD-10-CM | POA: Diagnosis not present

## 2018-05-05 DIAGNOSIS — H2512 Age-related nuclear cataract, left eye: Secondary | ICD-10-CM | POA: Diagnosis not present

## 2018-05-08 DIAGNOSIS — B078 Other viral warts: Secondary | ICD-10-CM | POA: Diagnosis not present

## 2018-05-08 DIAGNOSIS — D0472 Carcinoma in situ of skin of left lower limb, including hip: Secondary | ICD-10-CM | POA: Diagnosis not present

## 2018-05-08 DIAGNOSIS — D0471 Carcinoma in situ of skin of right lower limb, including hip: Secondary | ICD-10-CM | POA: Diagnosis not present

## 2018-05-08 DIAGNOSIS — L400 Psoriasis vulgaris: Secondary | ICD-10-CM | POA: Diagnosis not present

## 2018-05-08 DIAGNOSIS — L538 Other specified erythematous conditions: Secondary | ICD-10-CM | POA: Diagnosis not present

## 2018-05-26 DIAGNOSIS — M0579 Rheumatoid arthritis with rheumatoid factor of multiple sites without organ or systems involvement: Secondary | ICD-10-CM | POA: Diagnosis not present

## 2018-05-26 DIAGNOSIS — Z79899 Other long term (current) drug therapy: Secondary | ICD-10-CM | POA: Diagnosis not present

## 2018-06-30 DIAGNOSIS — R1032 Left lower quadrant pain: Secondary | ICD-10-CM | POA: Diagnosis not present

## 2018-07-14 ENCOUNTER — Encounter: Payer: Self-pay | Admitting: *Deleted

## 2018-07-21 NOTE — OR Nursing (Signed)
Per Dorian Pod at [facility]...Marland Kitchenok for patient to receive the compounded eye gel for preop.  She states patient "ok with NSAIDS" with out problem or complication

## 2018-07-22 ENCOUNTER — Ambulatory Visit: Payer: PPO | Admitting: Anesthesiology

## 2018-07-22 ENCOUNTER — Ambulatory Visit
Admission: RE | Admit: 2018-07-22 | Discharge: 2018-07-22 | Disposition: A | Discharge status: Home / Self Care | Payer: PPO | Source: Ambulatory Visit | Attending: Ophthalmology | Admitting: Ophthalmology

## 2018-07-22 ENCOUNTER — Encounter
Admission: RE | Disposition: A | Discharge status: Home / Self Care | Payer: Self-pay | Source: Ambulatory Visit | Attending: Ophthalmology

## 2018-07-22 ENCOUNTER — Other Ambulatory Visit: Payer: Self-pay

## 2018-07-22 DIAGNOSIS — M069 Rheumatoid arthritis, unspecified: Secondary | ICD-10-CM | POA: Diagnosis not present

## 2018-07-22 DIAGNOSIS — H2512 Age-related nuclear cataract, left eye: Secondary | ICD-10-CM | POA: Insufficient documentation

## 2018-07-22 DIAGNOSIS — Z8709 Personal history of other diseases of the respiratory system: Secondary | ICD-10-CM | POA: Insufficient documentation

## 2018-07-22 DIAGNOSIS — Z79899 Other long term (current) drug therapy: Secondary | ICD-10-CM | POA: Diagnosis not present

## 2018-07-22 DIAGNOSIS — Z87891 Personal history of nicotine dependence: Secondary | ICD-10-CM | POA: Insufficient documentation

## 2018-07-22 DIAGNOSIS — Z8673 Personal history of transient ischemic attack (TIA), and cerebral infarction without residual deficits: Secondary | ICD-10-CM | POA: Diagnosis not present

## 2018-07-22 DIAGNOSIS — G4730 Sleep apnea, unspecified: Secondary | ICD-10-CM | POA: Diagnosis not present

## 2018-07-22 HISTORY — PX: CATARACT EXTRACTION W/PHACO: SHX586

## 2018-07-22 HISTORY — DX: Cerebral infarction, unspecified: I63.9

## 2018-07-22 HISTORY — DX: Dizziness and giddiness: R42

## 2018-07-22 HISTORY — DX: Sleep apnea, unspecified: G47.30

## 2018-07-22 HISTORY — DX: Unspecified hearing loss, unspecified ear: H91.90

## 2018-07-22 SURGERY — PHACOEMULSIFICATION, CATARACT, WITH IOL INSERTION
Anesthesia: Monitor Anesthesia Care | Site: Eye | Laterality: Left | Wound class: "Clean "

## 2018-07-22 MED ORDER — POVIDONE-IODINE 5 % OP SOLN
OPHTHALMIC | Status: DC | PRN
  Administered 2018-07-22: 1 via OPHTHALMIC

## 2018-07-22 MED ORDER — GLYCOPYRROLATE 0.2 MG/ML IJ SOLN
INTRAMUSCULAR | Status: DC | PRN
  Administered 2018-07-22 (×2): 0.1 mg via INTRAVENOUS

## 2018-07-22 MED ORDER — LIDOCAINE HCL (PF) 4 % IJ SOLN
INTRAMUSCULAR | Status: AC
  Filled 2018-07-22: qty 5

## 2018-07-22 MED ORDER — MIDAZOLAM HCL 2 MG/2ML IJ SOLN
INTRAMUSCULAR | Status: DC | PRN
  Administered 2018-07-22: 1 mg via INTRAVENOUS

## 2018-07-22 MED ORDER — MOXIFLOXACIN HCL 0.5 % OP SOLN
OPHTHALMIC | Status: DC | PRN
  Administered 2018-07-22: .2 mL via OPHTHALMIC

## 2018-07-22 MED ORDER — MIDAZOLAM HCL 2 MG/2ML IJ SOLN
INTRAMUSCULAR | Status: AC
  Filled 2018-07-22: qty 2

## 2018-07-22 MED ORDER — CARBACHOL 0.01 % IO SOLN
INTRAOCULAR | Status: DC | PRN
  Administered 2018-07-22: .5 mL via INTRAOCULAR

## 2018-07-22 MED ORDER — GLYCOPYRROLATE 0.2 MG/ML IJ SOLN
INTRAMUSCULAR | Status: AC
  Filled 2018-07-22: qty 1

## 2018-07-22 MED ORDER — DEXMEDETOMIDINE HCL 200 MCG/2ML IV SOLN
INTRAVENOUS | Status: DC | PRN
  Administered 2018-07-22: 8 ug via INTRAVENOUS
  Administered 2018-07-22: 12 ug via INTRAVENOUS

## 2018-07-22 MED ORDER — POVIDONE-IODINE 5 % OP SOLN
OPHTHALMIC | Status: AC
  Filled 2018-07-22: qty 30

## 2018-07-22 MED ORDER — MOXIFLOXACIN HCL 0.5 % OP SOLN
OPHTHALMIC | Status: AC
  Filled 2018-07-22: qty 3

## 2018-07-22 MED ORDER — NA CHONDROIT SULF-NA HYALURON 40-17 MG/ML IO SOLN
INTRAOCULAR | Status: DC | PRN
  Administered 2018-07-22: 1 mL via INTRAOCULAR

## 2018-07-22 MED ORDER — NA CHONDROIT SULF-NA HYALURON 40-17 MG/ML IO SOLN
INTRAOCULAR | Status: AC
  Filled 2018-07-22: qty 1

## 2018-07-22 MED ORDER — EPINEPHRINE PF 1 MG/ML IJ SOLN
INTRAMUSCULAR | Status: AC
  Filled 2018-07-22: qty 1

## 2018-07-22 MED ORDER — SODIUM CHLORIDE 0.9 % IV SOLN
INTRAVENOUS | Status: DC
  Administered 2018-07-22: 07:00:00 via INTRAVENOUS

## 2018-07-22 MED ORDER — LIDOCAINE HCL (PF) 4 % IJ SOLN
INTRAOCULAR | Status: DC | PRN
  Administered 2018-07-22: 2 mL via OPHTHALMIC

## 2018-07-22 MED ORDER — EPINEPHRINE PF 1 MG/ML IJ SOLN
INTRAOCULAR | Status: DC | PRN
  Administered 2018-07-22: 1 mL via OPHTHALMIC

## 2018-07-22 MED ORDER — MOXIFLOXACIN HCL 0.5 % OP SOLN: 1.0000 [drp] | OPHTHALMIC | Status: DC | PRN

## 2018-07-22 MED ORDER — ARMC OPHTHALMIC DILATING DROPS
1.0000 "application " | OPHTHALMIC | Status: AC
  Administered 2018-07-22 (×3): 1 via OPHTHALMIC

## 2018-07-22 SURGICAL SUPPLY — 16 items
GLOVE BIO SURGEON STRL SZ8 (GLOVE) ×3 IMPLANT
GLOVE BIOGEL M 6.5 STRL (GLOVE) ×3 IMPLANT
GLOVE SURG LX 8.0 MICRO (GLOVE) ×2
GLOVE SURG LX STRL 8.0 MICRO (GLOVE) ×1 IMPLANT
GOWN STRL REUS W/ TWL LRG LVL3 (GOWN DISPOSABLE) ×2 IMPLANT
GOWN STRL REUS W/TWL LRG LVL3 (GOWN DISPOSABLE) ×4
LABEL CATARACT MEDS ST (LABEL) ×3 IMPLANT
LENS IOL TECNIS ITEC 26.0 (Intraocular Lens) ×2 IMPLANT
PACK CATARACT (MISCELLANEOUS) ×3 IMPLANT
PACK CATARACT BRASINGTON LX (MISCELLANEOUS) ×3 IMPLANT
PACK EYE AFTER SURG (MISCELLANEOUS) ×3 IMPLANT
SOL BSS BAG (MISCELLANEOUS) ×3
SOLUTION BSS BAG (MISCELLANEOUS) ×1 IMPLANT
SYR 5ML LL (SYRINGE) ×3 IMPLANT
WATER STERILE IRR 250ML POUR (IV SOLUTION) ×3 IMPLANT
WIPE NON LINTING 3.25X3.25 (MISCELLANEOUS) ×3 IMPLANT

## 2018-07-22 NOTE — Op Note (Signed)
PREOPERATIVE DIAGNOSIS:  Nuclear sclerotic cataract of the left eye.   POSTOPERATIVE DIAGNOSIS:  Nuclear sclerotic cataract of the left eye.   OPERATIVE PROCEDURE: Procedure(s): CATARACT EXTRACTION PHACO AND INTRAOCULAR LENS PLACEMENT (IOC)   SURGEON:  Birder Robson, MD.   ANESTHESIA:  Anesthesiologist: Gunnar Fusi, MD CRNA: Nile Riggs, CRNA  1.      Managed anesthesia care. 2.     0.85m of Shugarcaine was instilled following the paracentesis   COMPLICATIONS:  None.   TECHNIQUE:   Stop and chop   DESCRIPTION OF PROCEDURE:  The patient was examined and consented in the preoperative holding area where the aforementioned topical anesthesia was applied to the left eye and then brought back to the Operating Room where the left eye was prepped and draped in the usual sterile ophthalmic fashion and a lid speculum was placed. A paracentesis was created with the side port blade and the anterior chamber was filled with viscoelastic. A near clear corneal incision was performed with the steel keratome. A continuous curvilinear capsulorrhexis was performed with a cystotome followed by the capsulorrhexis forceps. Hydrodissection and hydrodelineation were carried out with BSS on a blunt cannula. The lens was removed in a stop and chop  technique and the remaining cortical material was removed with the irrigation-aspiration handpiece. The capsular bag was inflated with viscoelastic and the Technis ZCB00 lens was placed in the capsular bag without complication. The remaining viscoelastic was removed from the eye with the irrigation-aspiration handpiece. The wounds were hydrated. The anterior chamber was flushed with Miostat and the eye was inflated to physiologic pressure. 0.175mVigamox was placed in the anterior chamber. The wounds were found to be water tight. The eye was dressed with Vigamox. The patient was given protective glasses to wear throughout the day and a shield with which to  sleep tonight. The patient was also given drops with which to begin a drop regimen today and will follow-up with me in one day. Implant Name Type Inv. Item Serial No. Manufacturer Lot No. LRB No. Used  LENS IOL DIOP 26.0 - [id number] Intraocular Lens LENS IOL DIOP 26.0 [id number] AMO  Left 1    Procedure(s) with comments: CATARACT EXTRACTION PHACO AND INTRAOCULAR LENS PLACEMENT (IOC) (Left) - [place]:43 AP% 15.0 CDE 6.43 Fluid pack lot # [id number]  Electronically signed: WiBirder Robson/27/2019 8:20 AM

## 2018-07-22 NOTE — Discharge Instructions (Signed)
Eye Surgery Discharge Instructions    Expect mild scratchy sensation or mild soreness. DO NOT RUB YOUR EYE!  The day of surgery:  Minimal physical activity, but bed rest is not required  No reading, computer work, or close hand work  No bending, lifting, or straining.  May watch TV  For 24 hours:  No driving, legal decisions, or alcoholic beverages  Safety precautions  Eat anything you prefer: It is better to start with liquids, then soup then solid foods.  _____ Eye patch should be worn until postoperative exam tomorrow.  ____ Solar shield eyeglasses should be worn for comfort in the sunlight/patch while sleeping  Resume all regular medications including aspirin or Coumadin if these were discontinued prior to surgery. You may shower, bathe, shave, or wash your hair. Tylenol may be taken for mild discomfort.  Call your doctor if you experience significant pain, nausea, or vomiting, fever > 101 or other signs of infection. [phone] or [phone] Specific instructions:  Follow-up Information    Birder Robson, MD Follow up.   Specialty:  Ophthalmology Why:  07/23/18 at 8:50 Contact information: [address] [place] [address] [phone]

## 2018-07-22 NOTE — Anesthesia Post-op Follow-up Note (Signed)
Anesthesia QCDR form completed.        

## 2018-07-22 NOTE — H&P (Signed)
All labs reviewed. Abnormal studies sent to patients PCP when indicated.  Previous H&P reviewed, patient examined, there are NO CHANGES.  Michael Vaquera Porfilio8/27/20197:52 AM

## 2018-07-22 NOTE — Anesthesia Postprocedure Evaluation (Signed)
Anesthesia Post Note  Patient: Michael Benjamin  Procedure(s) Performed: CATARACT EXTRACTION PHACO AND INTRAOCULAR LENS PLACEMENT ([place]) (Left Eye)  Patient location during evaluation: Short Stay Anesthesia Type: MAC Level of consciousness: awake and alert, oriented and patient cooperative Pain management: satisfactory to patient Vital Signs Assessment: post-procedure vital signs reviewed and stable Respiratory status: spontaneous breathing and respiratory function stable Cardiovascular status: stable and blood pressure returned to baseline Postop Assessment: no headache, no backache, patient able to bend at knees, no apparent nausea or vomiting, adequate PO intake and able to ambulate Anesthetic complications: no     Last Vitals:  Vitals:   07/22/18 0632 07/22/18 0822  BP: 116/70 99/68  Pulse: 80 81  Resp: 16 14  Temp: (!) 36.3 C (!) 36.3 C  SpO2: 94% 97%    Last Pain:  Vitals:   07/22/18 0822  TempSrc: Temporal  PainSc: 0-No pain                 Vinette Crites H Syble Picco

## 2018-07-22 NOTE — Anesthesia Preprocedure Evaluation (Signed)
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) , neg COPD, former smoker,           Cardiovascular (-) hypertension(-) Past MI and (-) CHF (-) dysrhythmias (-) Valvular Problems/Murmurs     Neuro/Psych TIA   GI/Hepatic Neg liver ROS, PUD,   Endo/Other  neg diabetes  Renal/GU negative Renal ROS     Musculoskeletal   Abdominal   Peds  Hematology   Anesthesia Other Findings   Reproductive/Obstetrics                             Anesthesia Physical Anesthesia Plan  ASA: III  Anesthesia Plan: MAC   Post-op Pain Management:    Induction:   PONV Risk Score and Plan:   Airway Management Planned: Nasal Cannula  Additional Equipment:   Intra-op Plan:   Post-operative Plan:   Informed Consent: I have reviewed the patients History and Physical, chart, labs and discussed the procedure including the risks, benefits and alternatives for the proposed anesthesia with the patient or authorized representative who has indicated his/her understanding and acceptance.     Plan Discussed with:   Anesthesia Plan Comments:         Anesthesia Quick Evaluation

## 2018-07-22 NOTE — Transfer of Care (Signed)
Immediate Anesthesia Transfer of Care Note  Patient: Michael Benjamin  Procedure(s) Performed: CATARACT EXTRACTION PHACO AND INTRAOCULAR LENS PLACEMENT (IOC) (Left Eye)  Patient Location: Short Stay  Anesthesia Type:MAC  Level of Consciousness: awake, alert , oriented and patient cooperative  Airway & Oxygen Therapy: Patient Spontanous Breathing  Post-op Assessment: Report given to RN, Post -op Vital signs reviewed and stable and Patient moving all extremities  Post vital signs: Reviewed and stable  Last Vitals:  Vitals Value Taken Time  BP    Temp    Pulse    Resp    SpO2      Last Pain:  Vitals:   07/22/18 9507  TempSrc: Tympanic         Complications: No apparent anesthesia complications

## 2018-07-30 DIAGNOSIS — H2511 Age-related nuclear cataract, right eye: Secondary | ICD-10-CM | POA: Diagnosis not present

## 2018-07-31 ENCOUNTER — Encounter: Payer: Self-pay | Admitting: *Deleted

## 2018-08-05 ENCOUNTER — Ambulatory Visit: Payer: PPO | Admitting: Anesthesiology

## 2018-08-05 ENCOUNTER — Encounter
Admission: RE | Disposition: A | Discharge status: Home / Self Care | Payer: Self-pay | Source: Ambulatory Visit | Attending: Ophthalmology

## 2018-08-05 ENCOUNTER — Encounter: Payer: Self-pay | Admitting: Anesthesiology

## 2018-08-05 ENCOUNTER — Ambulatory Visit
Admission: RE | Admit: 2018-08-05 | Discharge: 2018-08-05 | Disposition: A | Discharge status: Home / Self Care | Payer: PPO | Source: Ambulatory Visit | Attending: Ophthalmology | Admitting: Ophthalmology

## 2018-08-05 DIAGNOSIS — H2511 Age-related nuclear cataract, right eye: Secondary | ICD-10-CM | POA: Diagnosis not present

## 2018-08-05 DIAGNOSIS — Z87891 Personal history of nicotine dependence: Secondary | ICD-10-CM | POA: Diagnosis not present

## 2018-08-05 DIAGNOSIS — Z8673 Personal history of transient ischemic attack (TIA), and cerebral infarction without residual deficits: Secondary | ICD-10-CM | POA: Insufficient documentation

## 2018-08-05 DIAGNOSIS — M069 Rheumatoid arthritis, unspecified: Secondary | ICD-10-CM | POA: Diagnosis not present

## 2018-08-05 DIAGNOSIS — G4730 Sleep apnea, unspecified: Secondary | ICD-10-CM | POA: Diagnosis not present

## 2018-08-05 HISTORY — PX: CATARACT EXTRACTION W/PHACO: SHX586

## 2018-08-05 SURGERY — PHACOEMULSIFICATION, CATARACT, WITH IOL INSERTION
Anesthesia: Monitor Anesthesia Care | Site: Eye | Laterality: Right

## 2018-08-05 MED ORDER — LIDOCAINE HCL (PF) 4 % IJ SOLN
INTRAOCULAR | Status: DC | PRN
  Administered 2018-08-05: 2 mL via OPHTHALMIC

## 2018-08-05 MED ORDER — MOXIFLOXACIN HCL 0.5 % OP SOLN
OPHTHALMIC | Status: AC
  Filled 2018-08-05: qty 3

## 2018-08-05 MED ORDER — MIDAZOLAM HCL 2 MG/2ML IJ SOLN
INTRAMUSCULAR | Status: DC | PRN
  Administered 2018-08-05 (×2): 1 mg via INTRAVENOUS

## 2018-08-05 MED ORDER — POVIDONE-IODINE 5 % OP SOLN
OPHTHALMIC | Status: DC | PRN
  Administered 2018-08-05: 1 via OPHTHALMIC

## 2018-08-05 MED ORDER — EPINEPHRINE PF 1 MG/ML IJ SOLN
INTRAOCULAR | Status: DC | PRN
  Administered 2018-08-05: 1 mL via OPHTHALMIC

## 2018-08-05 MED ORDER — ARMC OPHTHALMIC DILATING DROPS
1.0000 "application " | OPHTHALMIC | Status: AC
  Administered 2018-08-05 (×3): 1 via OPHTHALMIC

## 2018-08-05 MED ORDER — MOXIFLOXACIN HCL 0.5 % OP SOLN: 1.0000 [drp] | OPHTHALMIC | Status: DC | PRN

## 2018-08-05 MED ORDER — ONDANSETRON HCL 4 MG/2ML IJ SOLN: 4.0000 mg | Freq: Once | INTRAMUSCULAR | Status: DC | PRN

## 2018-08-05 MED ORDER — SODIUM CHLORIDE 0.9 % IV SOLN
INTRAVENOUS | Status: DC
  Administered 2018-08-05: 11:00:00 via INTRAVENOUS

## 2018-08-05 MED ORDER — MIDAZOLAM HCL 2 MG/2ML IJ SOLN
INTRAMUSCULAR | Status: AC
  Filled 2018-08-05: qty 2

## 2018-08-05 MED ORDER — CARBACHOL 0.01 % IO SOLN
INTRAOCULAR | Status: DC | PRN
  Administered 2018-08-05: .5 mL via INTRAOCULAR

## 2018-08-05 MED ORDER — MOXIFLOXACIN HCL 0.5 % OP SOLN
OPHTHALMIC | Status: DC | PRN
  Administered 2018-08-05: .2 mL via OPHTHALMIC

## 2018-08-05 MED ORDER — ARMC OPHTHALMIC DILATING DROPS
OPHTHALMIC | Status: AC
  Administered 2018-08-05: 1 via OPHTHALMIC
  Filled 2018-08-05: qty 0.5

## 2018-08-05 MED ORDER — NA CHONDROIT SULF-NA HYALURON 40-17 MG/ML IO SOLN
INTRAOCULAR | Status: DC | PRN
  Administered 2018-08-05: 1 mL via INTRAOCULAR

## 2018-08-05 SURGICAL SUPPLY — 16 items
GLOVE BIO SURGEON STRL SZ8 (GLOVE) ×3 IMPLANT
GLOVE BIOGEL M 6.5 STRL (GLOVE) ×3 IMPLANT
GLOVE SURG LX 8.0 MICRO (GLOVE) ×2
GLOVE SURG LX STRL 8.0 MICRO (GLOVE) ×1 IMPLANT
GOWN STRL REUS W/ TWL LRG LVL3 (GOWN DISPOSABLE) ×2 IMPLANT
GOWN STRL REUS W/TWL LRG LVL3 (GOWN DISPOSABLE) ×4
LABEL CATARACT MEDS ST (LABEL) ×3 IMPLANT
LENS IOL TECNIS ITEC 25.0 (Intraocular Lens) ×2 IMPLANT
PACK CATARACT (MISCELLANEOUS) ×3 IMPLANT
PACK CATARACT BRASINGTON LX (MISCELLANEOUS) ×3 IMPLANT
PACK EYE AFTER SURG (MISCELLANEOUS) ×3 IMPLANT
SOL BSS BAG (MISCELLANEOUS) ×3
SOLUTION BSS BAG (MISCELLANEOUS) ×1 IMPLANT
SYR 5ML LL (SYRINGE) ×3 IMPLANT
WATER STERILE IRR 250ML POUR (IV SOLUTION) ×3 IMPLANT
WIPE NON LINTING 3.25X3.25 (MISCELLANEOUS) ×3 IMPLANT

## 2018-08-05 NOTE — Anesthesia Preprocedure Evaluation (Signed)
Anesthesia Evaluation  Patient identified by MRN, date of birth, ID band Patient awake    Reviewed: Allergy & Precautions, NPO status , Patient's Chart, lab work & pertinent test results, reviewed documented beta blocker date and time   Airway Mallampati: II  TM Distance: >3 FB     Dental  (+) Chipped   Pulmonary sleep apnea , former smoker,           Cardiovascular      Neuro/Psych TIA   GI/Hepatic PUD,   Endo/Other    Renal/GU      Musculoskeletal  (+) Arthritis ,   Abdominal   Peds  Hematology   Anesthesia Other Findings Hx of UPPP. Sats run low usually at 94-95%.  Reproductive/Obstetrics                             Anesthesia Physical Anesthesia Plan  ASA: III  Anesthesia Plan: MAC   Post-op Pain Management:    Induction:   PONV Risk Score and Plan:   Airway Management Planned:   Additional Equipment:   Intra-op Plan:   Post-operative Plan:   Informed Consent: I have reviewed the patients History and Physical, chart, labs and discussed the procedure including the risks, benefits and alternatives for the proposed anesthesia with the patient or authorized representative who has indicated his/her understanding and acceptance.     Plan Discussed with: CRNA  Anesthesia Plan Comments:         Anesthesia Quick Evaluation

## 2018-08-05 NOTE — Transfer of Care (Signed)
Immediate Anesthesia Transfer of Care Note  Patient: Michael Benjamin  Procedure(s) Performed: CATARACT EXTRACTION PHACO AND INTRAOCULAR LENS PLACEMENT (IOC) (Right Eye)  Patient Location: Short Stay  Anesthesia Type:MAC  Level of Consciousness: awake, alert , oriented and patient cooperative  Airway & Oxygen Therapy: Patient Spontanous Breathing  Post-op Assessment: Report given to RN and Post -op Vital signs reviewed and stable  Post vital signs: Reviewed and stable  Last Vitals:  Vitals Value Taken Time  BP    Temp    Pulse    Resp    SpO2      Last Pain:  Vitals:   08/05/18 1103  TempSrc: Tympanic  PainSc: 0-No pain         Complications: No apparent anesthesia complications

## 2018-08-05 NOTE — H&P (Signed)
All labs reviewed. Abnormal studies sent to patients PCP when indicated.  Previous H&P reviewed, patient examined, there are NO CHANGES.  Yuvonne Lanahan Porfilio9/10/201911:13 AM

## 2018-08-05 NOTE — Op Note (Signed)
PREOPERATIVE DIAGNOSIS:  Nuclear sclerotic cataract of the right eye.   POSTOPERATIVE DIAGNOSIS:  nuclear sclerotic cataract right eye   OPERATIVE PROCEDURE: Procedure(s): CATARACT EXTRACTION PHACO AND INTRAOCULAR LENS PLACEMENT (IOC)   SURGEON:  Birder Robson, MD.   ANESTHESIA:  Anesthesiologist: Gunnar Bulla, MD CRNA: Eben Burow, CRNA  1.      Managed anesthesia care. 2.      0.51m of Shugarcaine was instilled in the eye following the paracentesis.   COMPLICATIONS:  None.   TECHNIQUE:   Stop and chop   DESCRIPTION OF PROCEDURE:  The patient was examined and consented in the preoperative holding area where the aforementioned topical anesthesia was applied to the right eye and then brought back to the Operating Room where the right eye was prepped and draped in the usual sterile ophthalmic fashion and a lid speculum was placed. A paracentesis was created with the side port blade and the anterior chamber was filled with viscoelastic. A near clear corneal incision was performed with the steel keratome. A continuous curvilinear capsulorrhexis was performed with a cystotome followed by the capsulorrhexis forceps. Hydrodissection and hydrodelineation were carried out with BSS on a blunt cannula. The lens was removed in a stop and chop  technique and the remaining cortical material was removed with the irrigation-aspiration handpiece. The capsular bag was inflated with viscoelastic and the Technis ZCB00  lens was placed in the capsular bag without complication. The remaining viscoelastic was removed from the eye with the irrigation-aspiration handpiece. The wounds were hydrated. The anterior chamber was flushed with Miostat and the eye was inflated to physiologic pressure. 0.175mof Vigamox was placed in the anterior chamber. The wounds were found to be water tight. The eye was dressed with Vigamox. The patient was given protective glasses to wear throughout the day and a shield with which to  sleep tonight. The patient was also given drops with which to begin a drop regimen today and will follow-up with me in one day. Implant Name Type Inv. Item Serial No. Manufacturer Lot No. LRB No. Used  LENS IOL DIOP 25.0 - [id number] Intraocular Lens LENS IOL DIOP 25.0 [id number] AMO  Right 1   Procedure(s) with comments: CATARACT EXTRACTION PHACO AND INTRAOCULAR LENS PLACEMENT (IOC) (Right) - [place]:48 AP% 15.8 CDE 7.64 fluid pack lot # [id number]  Electronically signed: WiBirder Robson/08/2018 12:04 PM

## 2018-08-05 NOTE — Anesthesia Postprocedure Evaluation (Signed)
Anesthesia Post Note  Patient: Michael Benjamin  Procedure(s) Performed: CATARACT EXTRACTION PHACO AND INTRAOCULAR LENS PLACEMENT ([place]) (Right Eye)  Patient location during evaluation: PACU Anesthesia Type: MAC Level of consciousness: awake and alert Pain management: pain level controlled Vital Signs Assessment: post-procedure vital signs reviewed and stable Respiratory status: spontaneous breathing, nonlabored ventilation, respiratory function stable and patient connected to nasal cannula oxygen Cardiovascular status: stable and blood pressure returned to baseline Postop Assessment: no apparent nausea or vomiting Anesthetic complications: no     Last Vitals:  Vitals:   08/05/18 1103 08/05/18 1207  BP: 117/84 103/63  Pulse: 88 74  Resp: 17 16  Temp: (!) 35.9 C 36.4 C  SpO2: 98% 97%    Last Pain:  Vitals:   08/05/18 1207  TempSrc: Temporal  PainSc: 0-No pain                 Larie Mathes S

## 2018-08-05 NOTE — Discharge Instructions (Addendum)
Eye Surgery Discharge Instructions  Expect mild scratchy sensation or mild soreness. DO NOT RUB YOUR EYE!  The day of surgery:  Minimal physical activity, but bed rest is not required  No reading, computer work, or close hand work  No bending, lifting, or straining.  May watch TV  For 24 hours:  No driving, legal decisions, or alcoholic beverages  Safety precautions  Eat anything you prefer: It is better to start with liquids, then soup then solid foods.  Solar shield eyeglasses should be worn for comfort in the sunlight/patch while sleeping  Resume all regular medications including aspirin or Coumadin if these were discontinued prior to surgery. You may shower, bathe, shave, or wash your hair. Tylenol may be taken for mild discomfort. Follow Dr. Inda Coke eye drop instruction sheet as reviewed.  Call your doctor if you experience significant pain, nausea, or vomiting, fever > 101 or other signs of infection. [phone] or [phone] Specific instructions:  Follow-up Information    Birder Robson, MD Follow up.   Specialty:  Ophthalmology Why:  08/06/18 @ 10:05 AM Contact information: [address] [place] [address] [phone]

## 2018-08-05 NOTE — Anesthesia Post-op Follow-up Note (Signed)
Anesthesia QCDR form completed.        

## 2018-08-06 ENCOUNTER — Encounter: Payer: Self-pay | Admitting: Ophthalmology

## 2018-08-12 DIAGNOSIS — Z08 Encounter for follow-up examination after completed treatment for malignant neoplasm: Secondary | ICD-10-CM | POA: Diagnosis not present

## 2018-08-12 DIAGNOSIS — L570 Actinic keratosis: Secondary | ICD-10-CM | POA: Diagnosis not present

## 2018-08-12 DIAGNOSIS — D0472 Carcinoma in situ of skin of left lower limb, including hip: Secondary | ICD-10-CM | POA: Diagnosis not present

## 2018-08-12 DIAGNOSIS — D0461 Carcinoma in situ of skin of right upper limb, including shoulder: Secondary | ICD-10-CM | POA: Diagnosis not present

## 2018-08-12 DIAGNOSIS — D485 Neoplasm of uncertain behavior of skin: Secondary | ICD-10-CM | POA: Diagnosis not present

## 2018-08-12 DIAGNOSIS — Z85828 Personal history of other malignant neoplasm of skin: Secondary | ICD-10-CM | POA: Diagnosis not present

## 2018-08-12 DIAGNOSIS — D0471 Carcinoma in situ of skin of right lower limb, including hip: Secondary | ICD-10-CM | POA: Diagnosis not present

## 2018-08-12 DIAGNOSIS — X32XXXA Exposure to sunlight, initial encounter: Secondary | ICD-10-CM | POA: Diagnosis not present

## 2018-08-18 DIAGNOSIS — Z79899 Other long term (current) drug therapy: Secondary | ICD-10-CM | POA: Diagnosis not present

## 2018-08-18 DIAGNOSIS — M810 Age-related osteoporosis without current pathological fracture: Secondary | ICD-10-CM | POA: Diagnosis not present

## 2018-08-18 DIAGNOSIS — E7800 Pure hypercholesterolemia, unspecified: Secondary | ICD-10-CM | POA: Diagnosis not present

## 2018-08-18 DIAGNOSIS — K5190 Ulcerative colitis, unspecified, without complications: Secondary | ICD-10-CM | POA: Diagnosis not present

## 2018-08-18 DIAGNOSIS — M0579 Rheumatoid arthritis with rheumatoid factor of multiple sites without organ or systems involvement: Secondary | ICD-10-CM | POA: Diagnosis not present

## 2018-08-18 DIAGNOSIS — Z125 Encounter for screening for malignant neoplasm of prostate: Secondary | ICD-10-CM | POA: Diagnosis not present

## 2018-08-21 DIAGNOSIS — K5130 Ulcerative (chronic) rectosigmoiditis without complications: Secondary | ICD-10-CM | POA: Diagnosis not present

## 2018-08-21 DIAGNOSIS — M0579 Rheumatoid arthritis with rheumatoid factor of multiple sites without organ or systems involvement: Secondary | ICD-10-CM | POA: Diagnosis not present

## 2018-08-21 DIAGNOSIS — E7800 Pure hypercholesterolemia, unspecified: Secondary | ICD-10-CM | POA: Diagnosis not present

## 2018-08-21 DIAGNOSIS — Z0000 Encounter for general adult medical examination without abnormal findings: Secondary | ICD-10-CM | POA: Diagnosis not present

## 2018-08-21 DIAGNOSIS — J410 Simple chronic bronchitis: Secondary | ICD-10-CM | POA: Diagnosis not present

## 2018-09-01 DIAGNOSIS — D0461 Carcinoma in situ of skin of right upper limb, including shoulder: Secondary | ICD-10-CM | POA: Diagnosis not present

## 2018-10-13 DIAGNOSIS — Z79899 Other long term (current) drug therapy: Secondary | ICD-10-CM | POA: Diagnosis not present

## 2018-10-13 DIAGNOSIS — M65341 Trigger finger, right ring finger: Secondary | ICD-10-CM | POA: Diagnosis not present

## 2018-10-13 DIAGNOSIS — M0579 Rheumatoid arthritis with rheumatoid factor of multiple sites without organ or systems involvement: Secondary | ICD-10-CM | POA: Diagnosis not present

## 2018-12-09 ENCOUNTER — Other Ambulatory Visit
Admission: RE | Admit: 2018-12-09 | Discharge: 2018-12-09 | Disposition: A | Discharge status: Home / Self Care | Payer: PPO | Source: Ambulatory Visit | Attending: Family Medicine | Admitting: Family Medicine

## 2018-12-09 DIAGNOSIS — R1032 Left lower quadrant pain: Secondary | ICD-10-CM | POA: Diagnosis not present

## 2018-12-09 DIAGNOSIS — M0579 Rheumatoid arthritis with rheumatoid factor of multiple sites without organ or systems involvement: Secondary | ICD-10-CM | POA: Diagnosis not present

## 2018-12-09 LAB — COMPREHENSIVE METABOLIC PANEL
ALT: 21 U/L (ref 0–44)
ANION GAP: 8 (ref 5–15)
AST: 23 U/L (ref 15–41)
Albumin: 4.1 g/dL (ref 3.5–5.0)
Alkaline Phosphatase: 52 U/L (ref 38–126)
BILIRUBIN TOTAL: 1.3 mg/dL — AB (ref 0.3–1.2)
BUN: 17 mg/dL (ref 8–23)
CALCIUM: 9.2 mg/dL (ref 8.9–10.3)
CO2: 23 mmol/L (ref 22–32)
CREATININE: 1.02 mg/dL (ref 0.61–1.24)
Chloride: 105 mmol/L (ref 98–111)
GFR calc Af Amer: >60 mL/min (ref >60)
Glucose, Bld: 100 mg/dL — ABNORMAL HIGH (ref 70–99)
POTASSIUM: 3.8 mmol/L (ref 3.5–5.1)
Sodium: 136 mmol/L (ref 135–145)
Total Protein: 7.6 g/dL (ref 6.5–8.1)

## 2018-12-09 LAB — AMYLASE: AMYLASE: 120 U/L — AB (ref 28–100)

## 2018-12-09 LAB — LIPASE, BLOOD: Lipase: 40 U/L (ref 11–51)

## 2019-01-06 DIAGNOSIS — M0579 Rheumatoid arthritis with rheumatoid factor of multiple sites without organ or systems involvement: Secondary | ICD-10-CM | POA: Diagnosis not present

## 2019-03-02 DIAGNOSIS — M629 Disorder of muscle, unspecified: Secondary | ICD-10-CM | POA: Diagnosis not present

## 2019-03-02 DIAGNOSIS — M2560 Stiffness of unspecified joint, not elsewhere classified: Secondary | ICD-10-CM | POA: Diagnosis not present

## 2019-03-02 DIAGNOSIS — M0579 Rheumatoid arthritis with rheumatoid factor of multiple sites without organ or systems involvement: Secondary | ICD-10-CM | POA: Diagnosis not present

## 2019-03-02 DIAGNOSIS — R05 Cough: Secondary | ICD-10-CM | POA: Diagnosis not present

## 2019-03-02 DIAGNOSIS — M545 Low back pain: Secondary | ICD-10-CM | POA: Diagnosis not present

## 2019-04-27 DIAGNOSIS — L400 Psoriasis vulgaris: Secondary | ICD-10-CM | POA: Diagnosis not present

## 2019-04-27 DIAGNOSIS — Z85828 Personal history of other malignant neoplasm of skin: Secondary | ICD-10-CM | POA: Diagnosis not present

## 2019-04-27 DIAGNOSIS — D044 Carcinoma in situ of skin of scalp and neck: Secondary | ICD-10-CM | POA: Diagnosis not present

## 2019-04-27 DIAGNOSIS — Z79899 Other long term (current) drug therapy: Secondary | ICD-10-CM | POA: Diagnosis not present

## 2019-04-27 DIAGNOSIS — M7122 Synovial cyst of popliteal space [Baker], left knee: Secondary | ICD-10-CM | POA: Diagnosis not present

## 2019-04-27 DIAGNOSIS — L570 Actinic keratosis: Secondary | ICD-10-CM | POA: Diagnosis not present

## 2019-04-27 DIAGNOSIS — M65341 Trigger finger, right ring finger: Secondary | ICD-10-CM | POA: Diagnosis not present

## 2019-04-27 DIAGNOSIS — X32XXXA Exposure to sunlight, initial encounter: Secondary | ICD-10-CM | POA: Diagnosis not present

## 2019-04-27 DIAGNOSIS — Z08 Encounter for follow-up examination after completed treatment for malignant neoplasm: Secondary | ICD-10-CM | POA: Diagnosis not present

## 2019-04-27 DIAGNOSIS — M0579 Rheumatoid arthritis with rheumatoid factor of multiple sites without organ or systems involvement: Secondary | ICD-10-CM | POA: Diagnosis not present

## 2019-04-27 DIAGNOSIS — D485 Neoplasm of uncertain behavior of skin: Secondary | ICD-10-CM | POA: Diagnosis not present

## 2019-05-04 DIAGNOSIS — D044 Carcinoma in situ of skin of scalp and neck: Secondary | ICD-10-CM | POA: Diagnosis not present

## 2019-06-22 DIAGNOSIS — M0579 Rheumatoid arthritis with rheumatoid factor of multiple sites without organ or systems involvement: Secondary | ICD-10-CM | POA: Diagnosis not present

## 2019-06-28 ENCOUNTER — Other Ambulatory Visit: Payer: Self-pay

## 2019-06-28 ENCOUNTER — Emergency Department
Admission: EM | Admit: 2019-06-28 | Discharge: 2019-06-28 | Disposition: A | Discharge status: Home / Self Care | Payer: PPO | Attending: Emergency Medicine | Admitting: Emergency Medicine

## 2019-06-28 ENCOUNTER — Encounter: Payer: Self-pay | Admitting: Emergency Medicine

## 2019-06-28 ENCOUNTER — Emergency Department: Payer: PPO

## 2019-06-28 ENCOUNTER — Ambulatory Visit: Payer: Self-pay

## 2019-06-28 DIAGNOSIS — J189 Pneumonia, unspecified organism: Secondary | ICD-10-CM | POA: Diagnosis not present

## 2019-06-28 DIAGNOSIS — R05 Cough: Secondary | ICD-10-CM

## 2019-06-28 DIAGNOSIS — Z79899 Other long term (current) drug therapy: Secondary | ICD-10-CM | POA: Diagnosis not present

## 2019-06-28 DIAGNOSIS — Z87891 Personal history of nicotine dependence: Secondary | ICD-10-CM | POA: Insufficient documentation

## 2019-06-28 DIAGNOSIS — M069 Rheumatoid arthritis, unspecified: Secondary | ICD-10-CM | POA: Diagnosis not present

## 2019-06-28 DIAGNOSIS — R509 Fever, unspecified: Secondary | ICD-10-CM | POA: Diagnosis not present

## 2019-06-28 DIAGNOSIS — R0602 Shortness of breath: Secondary | ICD-10-CM | POA: Insufficient documentation

## 2019-06-28 DIAGNOSIS — J90 Pleural effusion, not elsewhere classified: Secondary | ICD-10-CM | POA: Diagnosis not present

## 2019-06-28 DIAGNOSIS — J111 Influenza due to unidentified influenza virus with other respiratory manifestations: Secondary | ICD-10-CM | POA: Insufficient documentation

## 2019-06-28 DIAGNOSIS — Z20828 Contact with and (suspected) exposure to other viral communicable diseases: Secondary | ICD-10-CM | POA: Diagnosis not present

## 2019-06-28 DIAGNOSIS — R059 Cough, unspecified: Secondary | ICD-10-CM

## 2019-06-28 LAB — CBC WITH DIFFERENTIAL/PLATELET
Abs Immature Granulocytes: 0.11 10*3/uL — ABNORMAL HIGH (ref 0.00–0.07)
Basophils Absolute: 0 10*3/uL (ref 0.0–0.1)
Basophils Relative: 0 %
Eosinophils Absolute: 0 10*3/uL (ref 0.0–0.5)
Eosinophils Relative: 0 %
HCT: 38.4 % — ABNORMAL LOW (ref 39.0–52.0)
Hemoglobin: 12.9 g/dL — ABNORMAL LOW (ref 13.0–17.0)
Immature Granulocytes: 1 %
Lymphocytes Relative: 7 %
Lymphs Abs: 1 10*3/uL (ref 0.7–4.0)
MCH: 32.7 pg (ref 26.0–34.0)
MCHC: 33.6 g/dL (ref 30.0–36.0)
MCV: 97.2 fL (ref 80.0–100.0)
Monocytes Absolute: 1.6 10*3/uL — ABNORMAL HIGH (ref 0.1–1.0)
Monocytes Relative: 11 %
Neutro Abs: 11.9 10*3/uL — ABNORMAL HIGH (ref 1.7–7.7)
Neutrophils Relative %: 81 %
Platelets: 165 10*3/uL (ref 150–400)
RBC: 3.95 MIL/uL — ABNORMAL LOW (ref 4.22–5.81)
RDW: 14.6 % (ref 11.5–15.5)
WBC: 14.6 10*3/uL — ABNORMAL HIGH (ref 4.0–10.5)
nRBC: 0 % (ref 0.0–0.2)

## 2019-06-28 LAB — BASIC METABOLIC PANEL
Anion gap: 8 (ref 5–15)
BUN: 22 mg/dL (ref 8–23)
CO2: 24 mmol/L (ref 22–32)
Calcium: 9.1 mg/dL (ref 8.9–10.3)
Chloride: 103 mmol/L (ref 98–111)
Creatinine, Ser: 1.28 mg/dL — ABNORMAL HIGH (ref 0.61–1.24)
GFR calc Af Amer: >60 mL/min (ref >60)
GFR calc non Af Amer: 55 mL/min — ABNORMAL LOW (ref >60)
Glucose, Bld: 109 mg/dL — ABNORMAL HIGH (ref 70–99)
Potassium: 3.7 mmol/L (ref 3.5–5.1)
Sodium: 135 mmol/L (ref 135–145)

## 2019-06-28 LAB — LACTIC ACID, PLASMA: Lactic Acid, Venous: 1.1 mmol/L (ref 0.5–1.9)

## 2019-06-28 LAB — SARS CORONAVIRUS 2 BY RT PCR (HOSPITAL ORDER, PERFORMED IN ~~LOC~~ HOSPITAL LAB): SARS Coronavirus 2: NEGATIVE

## 2019-06-28 MED ORDER — AMOXICILLIN-POT CLAVULANATE 875-125 MG PO TABS: 1.0000 | ORAL_TABLET | Freq: Two times a day (BID) | ORAL | 0 refills | Status: DC

## 2019-06-28 MED ORDER — ONDANSETRON 4 MG PO TBDP: 4.0000 mg | ORAL_TABLET | Freq: Three times a day (TID) | ORAL | 0 refills | Status: DC | PRN

## 2019-06-28 NOTE — ED Triage Notes (Signed)
Pt to ED via POV c/o fever, cough, shortness of breath, and body aches x 1 day. Pt states that he has autoimmune disorder and is also on an immunosuppressant medication for RA. Pt was advised to come to ED to be checked for COVID.

## 2019-06-28 NOTE — Discharge Instructions (Addendum)
Your symptoms today are most consistent with a viral illness.  Your COVID test was negative, your lab tests and chest x-ray were all normal as well.  It is still possible that you have COVID-19 illness due to the absence of any other explanation for your symptoms.  You should continue to quarantine at home and monitor your symptoms over the next few days.  Return to the emergency room if your symptoms are worsening or he has new concerns.  In the meantime take Augmentin to treat possible early pneumonia and prevent pneumonia with your autoimmune conditions.     Person Under Monitoring Name: Michael Benjamin  Location: [place] [address]   Infection Prevention Recommendations for Individuals Confirmed to have, or Being Evaluated for, 2019 Novel Coronavirus (COVID-19) Infection Who Receive Care at Home  Individuals who are confirmed to have, or are being evaluated for, COVID-19 should follow the prevention steps below until a healthcare provider or local or state health department says they can return to normal activities.  Stay home except to get medical care You should restrict activities outside your home, except for getting medical care. Do not go to work, school, or public areas, and do not use public transportation or taxis.  Call ahead before visiting your doctor Before your medical appointment, call the healthcare provider and tell them that you have, or are being evaluated for, COVID-19 infection. This will help the healthcare providers office take steps to keep other people from getting infected. Ask your healthcare provider to call the local or state health department.  Monitor your symptoms Seek prompt medical attention if your illness is worsening (e.g., difficulty breathing). Before going to your medical appointment, call the healthcare provider and tell them that you have, or are being evaluated for, COVID-19 infection. Ask your healthcare provider to call the  local or state health department.  Wear a facemask You should wear a facemask that covers your nose and mouth when you are in the same room with other people and when you visit a healthcare provider. People who live with or visit you should also wear a facemask while they are in the same room with you.  Separate yourself from other people in your home As much as possible, you should stay in a different room from other people in your home. Also, you should use a separate bathroom, if available.  Avoid sharing household items You should not share dishes, drinking glasses, cups, eating utensils, towels, bedding, or other items with other people in your home. After using these items, you should wash them thoroughly with soap and water.  Cover your coughs and sneezes Cover your mouth and nose with a tissue when you cough or sneeze, or you can cough or sneeze into your sleeve. Throw used tissues in a lined trash can, and immediately wash your hands with soap and water for at least 20 seconds or use an alcohol-based hand rub.  Wash your [facility] your hands often and thoroughly with soap and water for at least 20 seconds. You can use an alcohol-based hand sanitizer if soap and water are not available and if your hands are not visibly dirty. Avoid touching your eyes, nose, and mouth with unwashed hands.   Prevention Steps for Caregivers and Household Members of Individuals Confirmed to have, or Being Evaluated for, COVID-19 Infection Being Cared for in the Home  If you live with, or provide care at home for, a person confirmed to have, or  being evaluated for, COVID-19 infection please follow these guidelines to prevent infection:  Follow healthcare providers instructions Make sure that you understand and can help the patient follow any healthcare provider instructions for all care.  Provide for the patients basic needs You should help the patient with basic needs in the home and  provide support for getting groceries, prescriptions, and other personal needs.  Monitor the patients symptoms If they are getting sicker, call his or her medical provider and tell them that the patient has, or is being evaluated for, COVID-19 infection. This will help the healthcare providers office take steps to keep other people from getting infected. Ask the healthcare provider to call the local or state health department.  Limit the number of people who have contact with the patient If possible, have only one caregiver for the patient. Other household members should stay in another home or place of residence. If this is not possible, they should stay in another room, or be separated from the patient as much as possible. Use a separate bathroom, if available. Restrict visitors who do not have an essential need to be in the home.  Keep older adults, very young children, and other sick people away from the patient Keep older adults, very young children, and those who have compromised immune systems or chronic health conditions away from the patient. This includes people with chronic heart, lung, or kidney conditions, diabetes, and cancer.  Ensure good ventilation Make sure that shared spaces in the home have good air flow, such as from an air conditioner or an opened window, weather permitting.  Wash your hands often Wash your hands often and thoroughly with soap and water for at least 20 seconds. You can use an alcohol based hand sanitizer if soap and water are not available and if your hands are not visibly dirty. Avoid touching your eyes, nose, and mouth with unwashed hands. Use disposable paper towels to dry your hands. If not available, use dedicated cloth towels and replace them when they become wet.  Wear a facemask and gloves Wear a disposable facemask at all times in the room and gloves when you touch or have contact with the patients blood, body fluids, and/or secretions or  excretions, such as sweat, saliva, sputum, nasal mucus, vomit, urine, or feces.  Ensure the mask fits over your nose and mouth tightly, and do not touch it during use. Throw out disposable facemasks and gloves after using them. Do not reuse. Wash your hands immediately after removing your facemask and gloves. If your personal clothing becomes contaminated, carefully remove clothing and launder. Wash your hands after handling contaminated clothing. Place all used disposable facemasks, gloves, and other waste in a lined container before disposing them with other household waste. Remove gloves and wash your hands immediately after handling these items.  Do not share dishes, glasses, or other household items with the patient Avoid sharing household items. You should not share dishes, drinking glasses, cups, eating utensils, towels, bedding, or other items with a patient who is confirmed to have, or being evaluated for, COVID-19 infection. After the person uses these items, you should wash them thoroughly with soap and water.  Wash laundry thoroughly Immediately remove and wash clothes or bedding that have blood, body fluids, and/or secretions or excretions, such as sweat, saliva, sputum, nasal mucus, vomit, urine, or feces, on them. Wear gloves when handling laundry from the patient. Read and follow directions on labels of laundry or clothing items and detergent. In  general, wash and dry with the warmest temperatures recommended on the label.  Clean all areas the individual has used often Clean all touchable surfaces, such as counters, tabletops, doorknobs, bathroom fixtures, toilets, phones, keyboards, tablets, and bedside tables, every day. Also, clean any surfaces that may have blood, body fluids, and/or secretions or excretions on them. Wear gloves when cleaning surfaces the patient has come in contact with. Use a diluted bleach solution (e.g., dilute bleach with 1 part bleach and 10 parts water)  or a household disinfectant with a label that says EPA-registered for coronaviruses. To make a bleach solution at home, add 1 tablespoon of bleach to 1 quart (4 cups) of water. For a larger supply, add  cup of bleach to 1 gallon (16 cups) of water. Read labels of cleaning products and follow recommendations provided on product labels. Labels contain instructions for safe and effective use of the cleaning product including precautions you should take when applying the product, such as wearing gloves or eye protection and making sure you have good ventilation during use of the product. Remove gloves and wash hands immediately after cleaning.  Monitor yourself for signs and symptoms of illness Caregivers and household members are considered close contacts, should monitor their health, and will be asked to limit movement outside of the home to the extent possible. Follow the monitoring steps for close contacts listed on the symptom monitoring form.   ? If you have additional questions, contact your local health department or call the epidemiologist on call at [phone] (available 24/7). ? This guidance is subject to change. For the most up-to-date guidance from [facility], please refer to their website: [email or web address]

## 2019-06-28 NOTE — Telephone Encounter (Signed)
Pt called with chills, SOB at rest and tachypnea. Pt symptom onset 06/27/19. Pt with dry cough. Pt also with body aches and fatigue. Pt stated that when he takes a deep breath that stimulates him to cough. Fever ranges 100-101. Temp was 101 this morning and chills are worse. Pt is high risk due to age, RA and is immunocompromised. Care advice given and pt verbalized understanding. Advised pt to wear a face mask upon arrival to the ED.  Eustace Pen at [facility]  And asked if there was any special instructions and he replied no.       Reason for Disposition . MODERATE difficulty breathing (e.g., speaks in phrases, SOB even at rest, pulse 100-120)  Answer Assessment - Initial Assessment Questions 1. COVID-19 DIAGNOSIS: "Who made your Coronavirus (COVID-19) diagnosis?" "Was it confirmed by a positive lab test?" If not diagnosed by a HCP, ask "Are there lots of cases (community spread) where you live?" (See public health department website, if unsure)     N/a-n/a-yes 2. ONSET: "When did the COVID-19 symptoms start?"      Yesterday 06/27/19 3. WORST SYMPTOM: "What is your worst symptom?" (e.g., cough, fever, shortness of breath, muscle aches)     Fatigue, body aches 4. COUGH: "Do you have a cough?" If so, ask: "How bad is the cough?"       Yes-dry cough-coughs with deep breath 5. FEVER: "Do you have a fever?" If so, ask: "What is your temperature, how was it measured, and when did it start?"     Chills, 100-101 oral thermometer-started last night 6. RESPIRATORY STATUS: "Describe your breathing?" (e.g., shortness of breath, wheezing, unable to speak)      SOB,at rest-breathing faster than normal 7. BETTER-SAME-WORSE: "Are you getting better, staying the same or getting worse compared to yesterday?"  If getting worse, ask, "In what way?"     Worse-achyness, fatigue 8. HIGH RISK DISEASE: "Do you have any chronic medical problems?" (e.g., asthma, heart or lung disease, weak immune system, etc.)     RA,  colitis, psoriasis, immunocompromised 9. PREGNANCY: "Is there any chance you are pregnant?" "When was your last menstrual period?"     n/a 10. OTHER SYMPTOMS: "Do you have any other symptoms?"  (e.g., chills, fatigue, headache, loss of smell or taste, muscle pain, sore throat)       Chills, fatigue, muscle pain  Protocols used: CORONAVIRUS (COVID-19) DIAGNOSED OR SUSPECTED-A-AH

## 2019-06-28 NOTE — ED Provider Notes (Signed)
[facility] Emergency Department Provider Note  ____________________________________________  Time seen: Approximately 1:10 PM  I have reviewed the triage vital signs and the nursing notes.   HISTORY  Chief Complaint Fever, Shortness of Breath, and Cough    HPI Michael Benjamin is a 73 y.o. male with a history of ulcerative colitis and rheumatoid arthritis, on immunosuppressant/disease modifying agents who comes the ED complaining of fatigue, shortness of breath, nonproductive cough, body aches, and fever that started 2 days ago gradually, constant, waxing waning without aggravating or alleviating factors.  This is after going to [facility] a week ago.  He does not believe that he has been exposed to anybody with COVID, reports that he is been wearing masks at all times.      Past Medical History:  Diagnosis Date  . Arthritis    methotrexate, prednisone, positive rheumatoid factor and anti CCP antibiodies  . Chronic ulcerative colitis with complication ([place])   . Chronic ulcerative pancolitis with complication ([place])   . Colitis   . HOH (hard of hearing)    AID  . Psoriasis   . Sleep apnea    H/O SURGERY  . Stroke [facility])    H/O TIA  . Vertigo    OCCAS     There are no active problems to display for this patient.    Past Surgical History:  Procedure Laterality Date  . CATARACT EXTRACTION W/PHACO Left 07/22/2018   Procedure: CATARACT EXTRACTION PHACO AND INTRAOCULAR LENS PLACEMENT (IOC);  Surgeon: Birder Robson, MD;  Location: [facility] ORS;  Service: Ophthalmology;  Laterality: Left;  [place] 00:43 AP% 15.0 CDE 6.43 Fluid pack lot # [id number]  . CATARACT EXTRACTION W/PHACO Right 08/05/2018   Procedure: CATARACT EXTRACTION PHACO AND INTRAOCULAR LENS PLACEMENT ([place]);  Surgeon: Birder Robson, MD;  Location: [facility] ORS;  Service: Ophthalmology;  Laterality: Right;  [place] 00:48 AP% 15.8 CDE 7.64 fluid pack lot # [id number]  . COLONOSCOPY WITH PROPOFOL N/A  12/20/2017   Procedure: COLONOSCOPY WITH PROPOFOL;  Surgeon: Manya Silvas, MD;  Location: [facility] ENDOSCOPY;  Service: Endoscopy;  Laterality: N/A;  . TONSILLECTOMY    . UVULOPALATOPHARYNGOPLASTY       Prior to Admission medications   Medication Sig Start Date End Date Taking? Authorizing Provider  diclofenac sodium (VOLTAREN) 1 % GEL Apply 2 g topically 4 (four) times daily. 02/13/19 02/13/20 Yes [provider]  methotrexate (RHEUMATREX) 2.5 MG tablet Take 20 mg by mouth once a week. Saturday 04/09/19  Yes [provider]  ACIDOPHILUS LACTOBACILLUS PO Take 1 capsule by mouth daily.     [provider]  alendronate (FOSAMAX) 70 MG tablet Take 70 mg by mouth once a week. Take with a full glass of water on an empty stomach.    [provider]  amoxicillin-clavulanate (AUGMENTIN) 875-125 MG tablet Take 1 tablet by mouth 2 (two) times daily. 06/28/19   Carrie Mew, MD  calcium gluconate 500 MG tablet Take 1 tablet by mouth daily.    [provider]  Cholecalciferol 2000 units TABS Take 2,000 Units by mouth daily.     [provider]  fluocinonide cream (LIDEX) 6.27 % Apply 1 application topically 2 (two) times a day. 05/04/19   [provider]  folic acid (FOLVITE) 1 MG tablet Take 1 mg by mouth daily.    [provider]  inFLIXimab-dyyb in sodium chloride 0.9 % Inject into the vein every 8 (eight) weeks.    [provider]  magnesium  oxide (MAG-OX) 400 MG tablet Take 400 mg by mouth daily.    [provider]  ondansetron (ZOFRAN ODT) 4 MG disintegrating tablet Take 1 tablet (4 mg total) by mouth every 8 (eight) hours as needed for nausea or vomiting. 06/28/19   Carrie Mew, MD  vitamin C (ASCORBIC ACID) 500 MG tablet Take 500 mg by mouth daily.    [provider]     Allergies Celebrex [celecoxib], Lobster [shellfish allergy], and Sulfa antibiotics   No family history on file.  Social  History Social History   Tobacco Use  . Smoking status: Former Smoker    Types: Cigarettes  . Smokeless tobacco: Never Used  Substance Use Topics  . Alcohol use: Yes    Comment: OCCAS  . Drug use: Never    Review of Systems  Constitutional: Positive fever and chills.  ENT:   No sore throat. No rhinorrhea. Cardiovascular:   No chest pain or syncope. Respiratory:   Positive shortness of breath and nonproductive cough. Gastrointestinal:   Negative for abdominal pain, vomiting and diarrhea.  Musculoskeletal:   Negative for focal pain or swelling All other systems reviewed and are negative except as documented above in ROS and HPI.  ____________________________________________   PHYSICAL EXAM:  VITAL SIGNS: ED Triage Vitals  Enc Vitals Group     BP 06/28/19 1148 115/68     Pulse Rate 06/28/19 1148 100     Resp 06/28/19 1148 (!) 22     Temp 06/28/19 1148 100.3 F (37.9 C)     Temp Source 06/28/19 1148 Oral     SpO2 06/28/19 1148 96 %     Weight 06/28/19 1149 165 lb (74.8 kg)     Height 06/28/19 1149 5' 8"  (1.727 m)     Head Circumference --      Peak Flow --      Pain Score 06/28/19 1149 0     Pain Loc --      Pain Edu? --      Excl. in [place]? --     Vital signs reviewed, nursing assessments reviewed.   Constitutional:   Alert and oriented. Non-toxic appearance. Eyes:   Conjunctivae are normal. EOMI. PERRL. ENT      Head:   Normocephalic and atraumatic.      Nose:   No congestion/rhinnorhea.       Mouth/Throat:   MMM, no pharyngeal erythema. No peritonsillar mass.       Neck:   No meningismus. Full ROM. Hematological/Lymphatic/Immunilogical:   No cervical lymphadenopathy. Cardiovascular:   RRR. Symmetric bilateral radial and DP pulses.  No murmurs. Cap refill less than 2 seconds. Respiratory:   Normal respiratory effort without tachypnea/retractions.  Bilateral basilar crackles, no wheezing Gastrointestinal:   Soft and nontender. Non distended. There is no CVA  tenderness.  No rebound, rigidity, or guarding.  Musculoskeletal:   Normal range of motion in all extremities. No joint effusions.  No lower extremity tenderness.  No edema. Neurologic:   Normal speech and language.  Motor grossly intact. No acute focal neurologic deficits are appreciated.  Skin:    Skin is warm, dry and intact. No rash noted.  No petechiae, purpura, or bullae.  ____________________________________________    LABS (pertinent positives/negatives) (all labs ordered are listed, but only abnormal results are displayed) Labs Reviewed  BASIC METABOLIC PANEL - Abnormal; Notable for the following components:      Result Value   Glucose, Bld 109 (*)    Creatinine, Ser 1.28 (*)  GFR calc non Af Amer 55 (*)    All other components within normal limits  CBC WITH DIFFERENTIAL/PLATELET - Abnormal; Notable for the following components:   WBC 14.6 (*)    RBC 3.95 (*)    Hemoglobin 12.9 (*)    HCT 38.4 (*)    Neutro Abs 11.9 (*)    Monocytes Absolute 1.6 (*)    Abs Immature Granulocytes 0.11 (*)    All other components within normal limits  SARS CORONAVIRUS 2 (HOSPITAL ORDER, [facility])  CULTURE, BLOOD (ROUTINE X 2)  CULTURE, BLOOD (ROUTINE X 2)  LACTIC ACID, PLASMA   ____________________________________________   EKG    ____________________________________________    RADIOLOGY  Dg Chest Portable 1 View  Result Date: 06/28/2019 CLINICAL DATA:  Pt to ED via POV c/o fever, cough, shortness of breath, and body aches x 1 day. Pt states that he has autoimmune disorder and is also on an immunosuppressant medication for RA. Pt was advised to come to ED to be checked for COVID. Reports recent trip to [place], [place]. Hx stroke. Former smoker. EXAM: PORTABLE CHEST 1 VIEW COMPARISON:  Chest CT, 01/21/2017 FINDINGS: Cardiac silhouette is normal in size. No mediastinal or hilar masses or evidence of adenopathy. There are prominent bronchovascular  markings with mild areas of interstitial thickening. No evidence of pneumonia or pulmonary edema. No pleural effusion or pneumothorax. Skeletal structures are grossly intact. IMPRESSION: No acute cardiopulmonary disease. Electronically Signed   By: Lajean Manes M.D.   On: 06/28/2019 12:46    ____________________________________________   PROCEDURES Procedures  ____________________________________________    CLINICAL IMPRESSION / ASSESSMENT AND PLAN / ED COURSE  Medications ordered in the ED: Medications - No data to display  Pertinent labs & imaging results that were available during my care of the patient were reviewed by me and considered in my medical decision making (see chart for details).  Michael Benjamin was evaluated in Emergency Department on 06/28/2019 for the symptoms described in the history of present illness. He was evaluated in the context of the global COVID-19 pandemic, which necessitated consideration that the patient might be at risk for infection with the SARS-CoV-2 virus that causes COVID-19. Institutional protocols and algorithms that pertain to the evaluation of patients at risk for COVID-19 are in a state of rapid change based on information released by regulatory bodies including the CDC and federal and state organizations. These policies and algorithms were followed during the patient's care in the ED.     Clinical Course as of Jun 27 1514  Sun Jun 28, 2019  1227 Patient presents with nonproductive cough, fever chills body aches, concerning for COVID.  We will check a chest x-ray and labs including lactate and blood cultures given his age and comorbidities.   [PS]  1311 Chest x-ray unremarkable.  Awaiting labs.   [PS]    Clinical Course User Index [PS] Carrie Mew, MD     ----------------------------------------- 3:15 PM on 06/28/2019 -----------------------------------------  Labs unremarkable, COVID negative.  Given the lack of any specific  explanation for her symptoms this influenza-like illness is most consistent with COVID-19.  I will have him continue to quarantine at home, take Augmentin due to his autoimmune conditions with potential immunosuppressive medications, follow-up with primary care, return precautions discussed  ____________________________________________   FINAL CLINICAL IMPRESSION(S) / ED DIAGNOSES    Final diagnoses:  Cough  Influenza-like illness     ED Discharge Orders  Ordered    amoxicillin-clavulanate (AUGMENTIN) 875-125 MG tablet  2 times daily     06/28/19 1514    ondansetron (ZOFRAN ODT) 4 MG disintegrating tablet  Every 8 hours PRN     06/28/19 1514          Portions of this note were generated with dragon dictation software. Dictation errors may occur despite best attempts at proofreading.   Carrie Mew, MD 06/28/19 [phone]

## 2019-06-28 NOTE — ED Notes (Addendum)
Pt states he was in [facility] last week and his symptoms just started 2days ago. Pt st he has been experiencing more than normal fatiuge coupled with SOB. Pt RR 36x a min. Other vitals WNL.   T: 101.8 P: 96 O2: 98 BP: 111/61

## 2019-06-28 NOTE — ED Notes (Signed)
Pt discharged: Ambulatory. D/C instructions given to patient. Pt verbalized understanding and had no further questions

## 2019-06-30 DIAGNOSIS — R0989 Other specified symptoms and signs involving the circulatory and respiratory systems: Secondary | ICD-10-CM | POA: Diagnosis not present

## 2019-06-30 DIAGNOSIS — J168 Pneumonia due to other specified infectious organisms: Secondary | ICD-10-CM | POA: Diagnosis not present

## 2019-06-30 DIAGNOSIS — J189 Pneumonia, unspecified organism: Secondary | ICD-10-CM | POA: Diagnosis not present

## 2019-07-03 LAB — CULTURE, BLOOD (ROUTINE X 2)
Culture: NO GROWTH
Culture: NO GROWTH

## 2019-07-07 DIAGNOSIS — M9901 Segmental and somatic dysfunction of cervical region: Secondary | ICD-10-CM | POA: Diagnosis not present

## 2019-07-07 DIAGNOSIS — M531 Cervicobrachial syndrome: Secondary | ICD-10-CM | POA: Diagnosis not present

## 2019-08-04 DIAGNOSIS — M0579 Rheumatoid arthritis with rheumatoid factor of multiple sites without organ or systems involvement: Secondary | ICD-10-CM | POA: Diagnosis not present

## 2019-08-04 DIAGNOSIS — Z79899 Other long term (current) drug therapy: Secondary | ICD-10-CM | POA: Diagnosis not present

## 2019-08-05 DIAGNOSIS — Z79899 Other long term (current) drug therapy: Secondary | ICD-10-CM | POA: Diagnosis not present

## 2019-08-05 DIAGNOSIS — Z125 Encounter for screening for malignant neoplasm of prostate: Secondary | ICD-10-CM | POA: Diagnosis not present

## 2019-08-05 DIAGNOSIS — M0579 Rheumatoid arthritis with rheumatoid factor of multiple sites without organ or systems involvement: Secondary | ICD-10-CM | POA: Diagnosis not present

## 2019-08-05 DIAGNOSIS — E7800 Pure hypercholesterolemia, unspecified: Secondary | ICD-10-CM | POA: Diagnosis not present

## 2019-08-25 DIAGNOSIS — E7800 Pure hypercholesterolemia, unspecified: Secondary | ICD-10-CM | POA: Diagnosis not present

## 2019-08-25 DIAGNOSIS — K5130 Ulcerative (chronic) rectosigmoiditis without complications: Secondary | ICD-10-CM | POA: Diagnosis not present

## 2019-08-25 DIAGNOSIS — J410 Simple chronic bronchitis: Secondary | ICD-10-CM | POA: Diagnosis not present

## 2019-08-25 DIAGNOSIS — Z0000 Encounter for general adult medical examination without abnormal findings: Secondary | ICD-10-CM | POA: Diagnosis not present

## 2019-08-25 DIAGNOSIS — Z23 Encounter for immunization: Secondary | ICD-10-CM | POA: Diagnosis not present

## 2019-08-25 DIAGNOSIS — M0579 Rheumatoid arthritis with rheumatoid factor of multiple sites without organ or systems involvement: Secondary | ICD-10-CM | POA: Diagnosis not present

## 2019-09-21 DIAGNOSIS — M0579 Rheumatoid arthritis with rheumatoid factor of multiple sites without organ or systems involvement: Secondary | ICD-10-CM | POA: Diagnosis not present

## 2019-10-05 DIAGNOSIS — D2261 Melanocytic nevi of right upper limb, including shoulder: Secondary | ICD-10-CM | POA: Diagnosis not present

## 2019-10-05 DIAGNOSIS — Z85828 Personal history of other malignant neoplasm of skin: Secondary | ICD-10-CM | POA: Diagnosis not present

## 2019-10-05 DIAGNOSIS — C44729 Squamous cell carcinoma of skin of left lower limb, including hip: Secondary | ICD-10-CM | POA: Diagnosis not present

## 2019-10-05 DIAGNOSIS — Z08 Encounter for follow-up examination after completed treatment for malignant neoplasm: Secondary | ICD-10-CM | POA: Diagnosis not present

## 2019-10-05 DIAGNOSIS — D485 Neoplasm of uncertain behavior of skin: Secondary | ICD-10-CM | POA: Diagnosis not present

## 2019-10-05 DIAGNOSIS — L80 Vitiligo: Secondary | ICD-10-CM | POA: Diagnosis not present

## 2019-10-05 DIAGNOSIS — C44722 Squamous cell carcinoma of skin of right lower limb, including hip: Secondary | ICD-10-CM | POA: Diagnosis not present

## 2019-10-05 DIAGNOSIS — D225 Melanocytic nevi of trunk: Secondary | ICD-10-CM | POA: Diagnosis not present

## 2019-10-05 DIAGNOSIS — L570 Actinic keratosis: Secondary | ICD-10-CM | POA: Diagnosis not present

## 2019-10-05 DIAGNOSIS — X32XXXA Exposure to sunlight, initial encounter: Secondary | ICD-10-CM | POA: Diagnosis not present

## 2019-10-28 DIAGNOSIS — C44729 Squamous cell carcinoma of skin of left lower limb, including hip: Secondary | ICD-10-CM | POA: Diagnosis not present

## 2019-11-02 DIAGNOSIS — K5190 Ulcerative colitis, unspecified, without complications: Secondary | ICD-10-CM | POA: Diagnosis not present

## 2019-11-02 DIAGNOSIS — M0579 Rheumatoid arthritis with rheumatoid factor of multiple sites without organ or systems involvement: Secondary | ICD-10-CM | POA: Diagnosis not present

## 2019-11-02 DIAGNOSIS — M810 Age-related osteoporosis without current pathological fracture: Secondary | ICD-10-CM | POA: Diagnosis not present

## 2019-11-11 DIAGNOSIS — C44722 Squamous cell carcinoma of skin of right lower limb, including hip: Secondary | ICD-10-CM | POA: Diagnosis not present

## 2019-12-15 ENCOUNTER — Ambulatory Visit: Payer: PPO | Attending: Internal Medicine

## 2019-12-15 DIAGNOSIS — Z23 Encounter for immunization: Secondary | ICD-10-CM

## 2019-12-15 NOTE — Progress Notes (Signed)
   Covid-19 Vaccination Clinic  Name:  Michael Benjamin    MRN: [id number] DOB: [birth year]  12/15/2019  [name] was observed post Covid-19 immunization for 15 minutes without incidence. He was provided with Vaccine Information Sheet and instruction to access the V-Safe system.   [name] was instructed to call 911 with any severe reactions post vaccine: Marland Kitchen Difficulty breathing  . Swelling of your face and throat  . A fast heartbeat  . A bad rash all over your body  . Dizziness and weakness    Immunizations Administered    Name Date Dose VIS Date Route   Pfizer COVID-19 Vaccine 12/15/2019  5:05 PM 0.3 mL 11/06/2019 Intramuscular   Manufacturer: [place]   Lot: [id number]   [place]: [id number]

## 2019-12-22 DIAGNOSIS — M810 Age-related osteoporosis without current pathological fracture: Secondary | ICD-10-CM | POA: Diagnosis not present

## 2019-12-22 DIAGNOSIS — K5190 Ulcerative colitis, unspecified, without complications: Secondary | ICD-10-CM | POA: Diagnosis not present

## 2019-12-22 DIAGNOSIS — Z79899 Other long term (current) drug therapy: Secondary | ICD-10-CM | POA: Diagnosis not present

## 2019-12-22 DIAGNOSIS — M0579 Rheumatoid arthritis with rheumatoid factor of multiple sites without organ or systems involvement: Secondary | ICD-10-CM | POA: Diagnosis not present

## 2020-01-03 ENCOUNTER — Ambulatory Visit: Payer: PPO | Attending: Internal Medicine

## 2020-01-03 DIAGNOSIS — Z23 Encounter for immunization: Secondary | ICD-10-CM | POA: Insufficient documentation

## 2020-01-03 NOTE — Progress Notes (Signed)
   Covid-19 Vaccination Clinic  Name:  Michael Benjamin    MRN: [id number] DOB: [birth year]  01/03/2020  [name] was observed post Covid-19 immunization for 15 minutes without incidence. He was provided with Vaccine Information Sheet and instruction to access the V-Safe system.   [name] was instructed to call 911 with any severe reactions post vaccine: Marland Kitchen Difficulty breathing  . Swelling of your face and throat  . A fast heartbeat  . A bad rash all over your body  . Dizziness and weakness    Immunizations Administered    Name Date Dose VIS Date Route   Pfizer COVID-19 Vaccine 01/03/2020 10:35 AM 0.3 mL 11/06/2019 Intramuscular   Manufacturer: [place]   Lot: [id number]   [place]: [id number]

## 2020-01-15 DIAGNOSIS — R1032 Left lower quadrant pain: Secondary | ICD-10-CM | POA: Diagnosis not present

## 2020-01-15 DIAGNOSIS — K5130 Ulcerative (chronic) rectosigmoiditis without complications: Secondary | ICD-10-CM | POA: Diagnosis not present

## 2020-01-15 DIAGNOSIS — K5792 Diverticulitis of intestine, part unspecified, without perforation or abscess without bleeding: Secondary | ICD-10-CM | POA: Diagnosis not present

## 2020-01-15 DIAGNOSIS — E7800 Pure hypercholesterolemia, unspecified: Secondary | ICD-10-CM | POA: Diagnosis not present

## 2020-01-15 DIAGNOSIS — M0579 Rheumatoid arthritis with rheumatoid factor of multiple sites without organ or systems involvement: Secondary | ICD-10-CM | POA: Diagnosis not present

## 2020-03-01 DIAGNOSIS — D2261 Melanocytic nevi of right upper limb, including shoulder: Secondary | ICD-10-CM | POA: Diagnosis not present

## 2020-03-01 DIAGNOSIS — L570 Actinic keratosis: Secondary | ICD-10-CM | POA: Diagnosis not present

## 2020-03-01 DIAGNOSIS — D0461 Carcinoma in situ of skin of right upper limb, including shoulder: Secondary | ICD-10-CM | POA: Diagnosis not present

## 2020-03-01 DIAGNOSIS — D045 Carcinoma in situ of skin of trunk: Secondary | ICD-10-CM | POA: Diagnosis not present

## 2020-03-01 DIAGNOSIS — D2271 Melanocytic nevi of right lower limb, including hip: Secondary | ICD-10-CM | POA: Diagnosis not present

## 2020-03-01 DIAGNOSIS — L821 Other seborrheic keratosis: Secondary | ICD-10-CM | POA: Diagnosis not present

## 2020-03-01 DIAGNOSIS — X32XXXA Exposure to sunlight, initial encounter: Secondary | ICD-10-CM | POA: Diagnosis not present

## 2020-03-01 DIAGNOSIS — D2262 Melanocytic nevi of left upper limb, including shoulder: Secondary | ICD-10-CM | POA: Diagnosis not present

## 2020-03-01 DIAGNOSIS — D485 Neoplasm of uncertain behavior of skin: Secondary | ICD-10-CM | POA: Diagnosis not present

## 2020-03-01 DIAGNOSIS — Z85828 Personal history of other malignant neoplasm of skin: Secondary | ICD-10-CM | POA: Diagnosis not present

## 2020-03-07 DIAGNOSIS — R1032 Left lower quadrant pain: Secondary | ICD-10-CM | POA: Diagnosis not present

## 2020-03-07 DIAGNOSIS — M0579 Rheumatoid arthritis with rheumatoid factor of multiple sites without organ or systems involvement: Secondary | ICD-10-CM | POA: Diagnosis not present

## 2020-03-07 DIAGNOSIS — K5130 Ulcerative (chronic) rectosigmoiditis without complications: Secondary | ICD-10-CM | POA: Diagnosis not present

## 2020-03-07 DIAGNOSIS — Z8719 Personal history of other diseases of the digestive system: Secondary | ICD-10-CM | POA: Diagnosis not present

## 2020-03-07 DIAGNOSIS — E7800 Pure hypercholesterolemia, unspecified: Secondary | ICD-10-CM | POA: Diagnosis not present

## 2020-03-22 DIAGNOSIS — Z79899 Other long term (current) drug therapy: Secondary | ICD-10-CM | POA: Diagnosis not present

## 2020-03-22 DIAGNOSIS — M0579 Rheumatoid arthritis with rheumatoid factor of multiple sites without organ or systems involvement: Secondary | ICD-10-CM | POA: Diagnosis not present

## 2020-03-29 DIAGNOSIS — D045 Carcinoma in situ of skin of trunk: Secondary | ICD-10-CM | POA: Diagnosis not present

## 2020-03-29 DIAGNOSIS — D0461 Carcinoma in situ of skin of right upper limb, including shoulder: Secondary | ICD-10-CM | POA: Diagnosis not present

## 2020-05-19 DIAGNOSIS — K5190 Ulcerative colitis, unspecified, without complications: Secondary | ICD-10-CM | POA: Diagnosis not present

## 2020-05-19 DIAGNOSIS — Z79899 Other long term (current) drug therapy: Secondary | ICD-10-CM | POA: Diagnosis not present

## 2020-05-19 DIAGNOSIS — M0579 Rheumatoid arthritis with rheumatoid factor of multiple sites without organ or systems involvement: Secondary | ICD-10-CM | POA: Diagnosis not present

## 2020-05-19 DIAGNOSIS — M810 Age-related osteoporosis without current pathological fracture: Secondary | ICD-10-CM | POA: Diagnosis not present

## 2020-05-23 DIAGNOSIS — Z79899 Other long term (current) drug therapy: Secondary | ICD-10-CM | POA: Diagnosis not present

## 2020-05-23 DIAGNOSIS — M05741 Rheumatoid arthritis with rheumatoid factor of right hand without organ or systems involvement: Secondary | ICD-10-CM | POA: Diagnosis not present

## 2020-05-25 DIAGNOSIS — M810 Age-related osteoporosis without current pathological fracture: Secondary | ICD-10-CM | POA: Diagnosis not present

## 2020-07-05 DIAGNOSIS — M810 Age-related osteoporosis without current pathological fracture: Secondary | ICD-10-CM | POA: Diagnosis not present

## 2020-07-05 DIAGNOSIS — K5190 Ulcerative colitis, unspecified, without complications: Secondary | ICD-10-CM | POA: Diagnosis not present

## 2020-07-05 DIAGNOSIS — M0579 Rheumatoid arthritis with rheumatoid factor of multiple sites without organ or systems involvement: Secondary | ICD-10-CM | POA: Diagnosis not present

## 2020-08-10 DIAGNOSIS — Z08 Encounter for follow-up examination after completed treatment for malignant neoplasm: Secondary | ICD-10-CM | POA: Diagnosis not present

## 2020-08-10 DIAGNOSIS — Z85828 Personal history of other malignant neoplasm of skin: Secondary | ICD-10-CM | POA: Diagnosis not present

## 2020-08-10 DIAGNOSIS — L80 Vitiligo: Secondary | ICD-10-CM | POA: Diagnosis not present

## 2020-08-10 DIAGNOSIS — L821 Other seborrheic keratosis: Secondary | ICD-10-CM | POA: Diagnosis not present

## 2020-08-10 DIAGNOSIS — L570 Actinic keratosis: Secondary | ICD-10-CM | POA: Diagnosis not present

## 2020-08-10 DIAGNOSIS — X32XXXA Exposure to sunlight, initial encounter: Secondary | ICD-10-CM | POA: Diagnosis not present

## 2020-08-19 DIAGNOSIS — Z79899 Other long term (current) drug therapy: Secondary | ICD-10-CM | POA: Diagnosis not present

## 2020-08-19 DIAGNOSIS — M0579 Rheumatoid arthritis with rheumatoid factor of multiple sites without organ or systems involvement: Secondary | ICD-10-CM | POA: Diagnosis not present

## 2020-11-28 DIAGNOSIS — Z79899 Other long term (current) drug therapy: Secondary | ICD-10-CM | POA: Diagnosis not present

## 2020-11-28 DIAGNOSIS — M0579 Rheumatoid arthritis with rheumatoid factor of multiple sites without organ or systems involvement: Secondary | ICD-10-CM | POA: Diagnosis not present

## 2020-11-29 DIAGNOSIS — K5190 Ulcerative colitis, unspecified, without complications: Secondary | ICD-10-CM | POA: Diagnosis not present

## 2020-11-29 DIAGNOSIS — Z79899 Other long term (current) drug therapy: Secondary | ICD-10-CM | POA: Diagnosis not present

## 2020-11-29 DIAGNOSIS — M0579 Rheumatoid arthritis with rheumatoid factor of multiple sites without organ or systems involvement: Secondary | ICD-10-CM | POA: Diagnosis not present

## 2020-11-29 DIAGNOSIS — M810 Age-related osteoporosis without current pathological fracture: Secondary | ICD-10-CM | POA: Diagnosis not present

## 2020-12-22 DIAGNOSIS — E7800 Pure hypercholesterolemia, unspecified: Secondary | ICD-10-CM | POA: Diagnosis not present

## 2020-12-22 DIAGNOSIS — Z125 Encounter for screening for malignant neoplasm of prostate: Secondary | ICD-10-CM | POA: Diagnosis not present

## 2020-12-27 DIAGNOSIS — J410 Simple chronic bronchitis: Secondary | ICD-10-CM | POA: Diagnosis not present

## 2020-12-27 DIAGNOSIS — G629 Polyneuropathy, unspecified: Secondary | ICD-10-CM | POA: Diagnosis not present

## 2020-12-27 DIAGNOSIS — E7800 Pure hypercholesterolemia, unspecified: Secondary | ICD-10-CM | POA: Diagnosis not present

## 2020-12-27 DIAGNOSIS — N1831 Chronic kidney disease, stage 3a: Secondary | ICD-10-CM | POA: Insufficient documentation

## 2020-12-27 DIAGNOSIS — K5130 Ulcerative (chronic) rectosigmoiditis without complications: Secondary | ICD-10-CM | POA: Diagnosis not present

## 2020-12-27 DIAGNOSIS — M0579 Rheumatoid arthritis with rheumatoid factor of multiple sites without organ or systems involvement: Secondary | ICD-10-CM | POA: Diagnosis not present

## 2020-12-27 DIAGNOSIS — Z0000 Encounter for general adult medical examination without abnormal findings: Secondary | ICD-10-CM | POA: Diagnosis not present

## 2021-02-27 DIAGNOSIS — D2271 Melanocytic nevi of right lower limb, including hip: Secondary | ICD-10-CM | POA: Diagnosis not present

## 2021-02-27 DIAGNOSIS — Z79899 Other long term (current) drug therapy: Secondary | ICD-10-CM | POA: Diagnosis not present

## 2021-02-27 DIAGNOSIS — G629 Polyneuropathy, unspecified: Secondary | ICD-10-CM | POA: Diagnosis not present

## 2021-02-27 DIAGNOSIS — D0462 Carcinoma in situ of skin of left upper limb, including shoulder: Secondary | ICD-10-CM | POA: Diagnosis not present

## 2021-02-27 DIAGNOSIS — Z85828 Personal history of other malignant neoplasm of skin: Secondary | ICD-10-CM | POA: Diagnosis not present

## 2021-02-27 DIAGNOSIS — X32XXXA Exposure to sunlight, initial encounter: Secondary | ICD-10-CM | POA: Diagnosis not present

## 2021-02-27 DIAGNOSIS — D225 Melanocytic nevi of trunk: Secondary | ICD-10-CM | POA: Diagnosis not present

## 2021-02-27 DIAGNOSIS — L570 Actinic keratosis: Secondary | ICD-10-CM | POA: Diagnosis not present

## 2021-02-27 DIAGNOSIS — M0579 Rheumatoid arthritis with rheumatoid factor of multiple sites without organ or systems involvement: Secondary | ICD-10-CM | POA: Diagnosis not present

## 2021-02-27 DIAGNOSIS — D485 Neoplasm of uncertain behavior of skin: Secondary | ICD-10-CM | POA: Diagnosis not present

## 2021-02-27 DIAGNOSIS — D2261 Melanocytic nevi of right upper limb, including shoulder: Secondary | ICD-10-CM | POA: Diagnosis not present

## 2021-03-20 DIAGNOSIS — D0462 Carcinoma in situ of skin of left upper limb, including shoulder: Secondary | ICD-10-CM | POA: Diagnosis not present

## 2021-03-20 DIAGNOSIS — C44629 Squamous cell carcinoma of skin of left upper limb, including shoulder: Secondary | ICD-10-CM | POA: Diagnosis not present

## 2021-03-20 DIAGNOSIS — L570 Actinic keratosis: Secondary | ICD-10-CM | POA: Diagnosis not present

## 2021-03-20 DIAGNOSIS — M0579 Rheumatoid arthritis with rheumatoid factor of multiple sites without organ or systems involvement: Secondary | ICD-10-CM | POA: Diagnosis not present

## 2021-04-01 IMAGING — DX PORTABLE CHEST - 1 VIEW
1 series · 1 of 1 positions shown · non-contrast
Comparison: Chest CT, 01/21/2017

CLINICAL DATA: Pt to ED via POV c/o fever, cough, shortness of
breath, and body aches x 1 day. Pt states that he has autoimmune
disorder and is also on an immunosuppressant medication for RA. Pt
was advised to come to ED to be checked for COVID. Reports recent
trip to [place]. Hx stroke. Former smoker.

EXAM:
PORTABLE CHEST 1 VIEW

[chest ap]
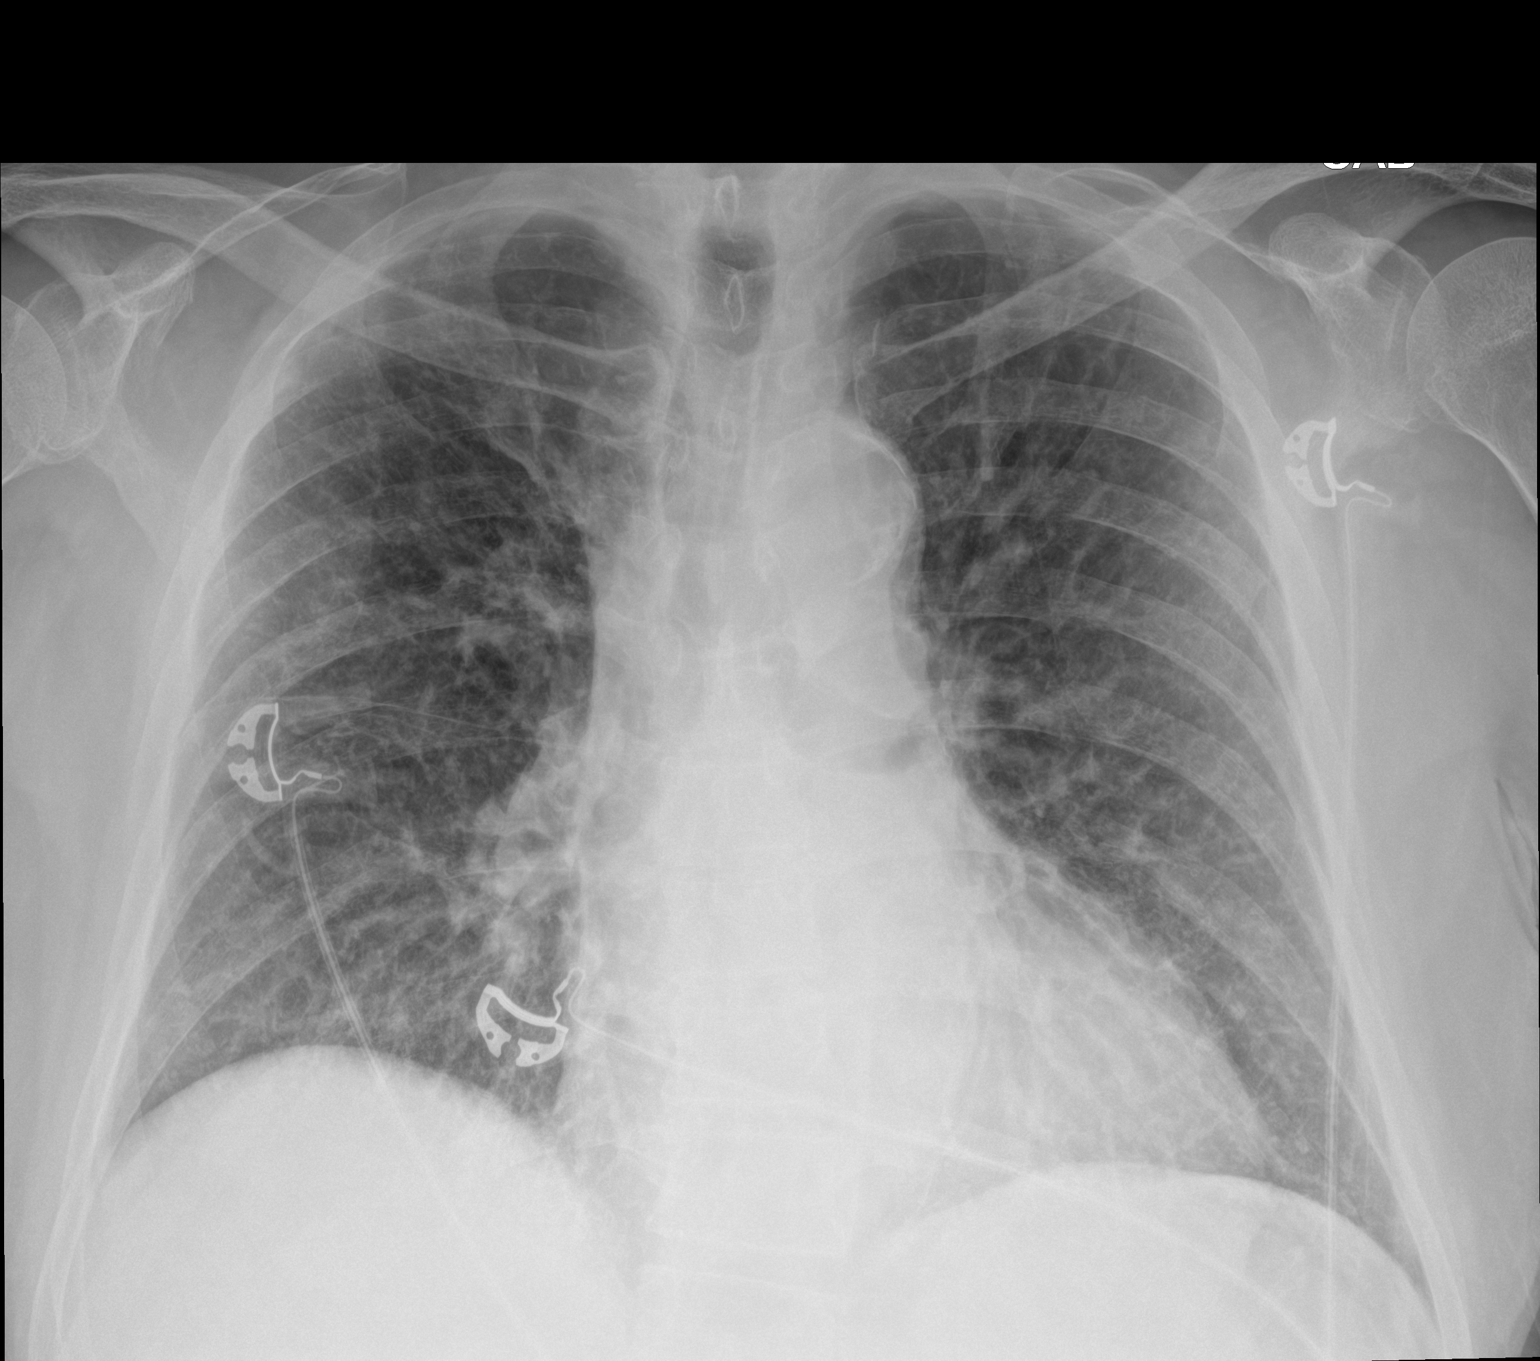

[1 of 1 positions shown; findings below may reference images not displayed]

FINDINGS: Cardiac silhouette is normal in size. No mediastinal or hilar masses
or evidence of adenopathy.

There are prominent bronchovascular markings with mild areas of
interstitial thickening. No evidence of pneumonia or pulmonary
edema. No pleural effusion or pneumothorax.

Skeletal structures are grossly intact.
IMPRESSION: No acute cardiopulmonary disease.

## 2021-04-03 DIAGNOSIS — M0579 Rheumatoid arthritis with rheumatoid factor of multiple sites without organ or systems involvement: Secondary | ICD-10-CM | POA: Diagnosis not present

## 2021-04-21 DIAGNOSIS — R0602 Shortness of breath: Secondary | ICD-10-CM | POA: Diagnosis not present

## 2021-04-21 DIAGNOSIS — M0579 Rheumatoid arthritis with rheumatoid factor of multiple sites without organ or systems involvement: Secondary | ICD-10-CM | POA: Diagnosis not present

## 2021-04-21 DIAGNOSIS — R5383 Other fatigue: Secondary | ICD-10-CM | POA: Diagnosis not present

## 2021-04-21 DIAGNOSIS — Z20822 Contact with and (suspected) exposure to covid-19: Secondary | ICD-10-CM | POA: Diagnosis not present

## 2021-04-21 DIAGNOSIS — R059 Cough, unspecified: Secondary | ICD-10-CM | POA: Diagnosis not present

## 2021-04-21 DIAGNOSIS — I700 Atherosclerosis of aorta: Secondary | ICD-10-CM | POA: Diagnosis not present

## 2021-04-21 DIAGNOSIS — R058 Other specified cough: Secondary | ICD-10-CM | POA: Diagnosis not present

## 2021-05-01 DIAGNOSIS — M0579 Rheumatoid arthritis with rheumatoid factor of multiple sites without organ or systems involvement: Secondary | ICD-10-CM | POA: Diagnosis not present

## 2021-05-24 DIAGNOSIS — M05741 Rheumatoid arthritis with rheumatoid factor of right hand without organ or systems involvement: Secondary | ICD-10-CM | POA: Diagnosis not present

## 2021-05-24 DIAGNOSIS — H5501 Congenital nystagmus: Secondary | ICD-10-CM | POA: Diagnosis not present

## 2021-05-24 DIAGNOSIS — Z79899 Other long term (current) drug therapy: Secondary | ICD-10-CM | POA: Diagnosis not present

## 2021-05-26 DIAGNOSIS — M0579 Rheumatoid arthritis with rheumatoid factor of multiple sites without organ or systems involvement: Secondary | ICD-10-CM | POA: Diagnosis not present

## 2021-05-26 DIAGNOSIS — Z79899 Other long term (current) drug therapy: Secondary | ICD-10-CM | POA: Diagnosis not present

## 2021-06-06 DIAGNOSIS — G629 Polyneuropathy, unspecified: Secondary | ICD-10-CM | POA: Diagnosis not present

## 2021-06-06 DIAGNOSIS — K5190 Ulcerative colitis, unspecified, without complications: Secondary | ICD-10-CM | POA: Diagnosis not present

## 2021-06-06 DIAGNOSIS — M0579 Rheumatoid arthritis with rheumatoid factor of multiple sites without organ or systems involvement: Secondary | ICD-10-CM | POA: Diagnosis not present

## 2021-06-06 DIAGNOSIS — M810 Age-related osteoporosis without current pathological fracture: Secondary | ICD-10-CM | POA: Diagnosis not present

## 2021-06-26 DIAGNOSIS — M0579 Rheumatoid arthritis with rheumatoid factor of multiple sites without organ or systems involvement: Secondary | ICD-10-CM | POA: Diagnosis not present

## 2021-07-24 DIAGNOSIS — D485 Neoplasm of uncertain behavior of skin: Secondary | ICD-10-CM | POA: Diagnosis not present

## 2021-07-24 DIAGNOSIS — L570 Actinic keratosis: Secondary | ICD-10-CM | POA: Diagnosis not present

## 2021-07-24 DIAGNOSIS — D2262 Melanocytic nevi of left upper limb, including shoulder: Secondary | ICD-10-CM | POA: Diagnosis not present

## 2021-07-24 DIAGNOSIS — X32XXXA Exposure to sunlight, initial encounter: Secondary | ICD-10-CM | POA: Diagnosis not present

## 2021-07-24 DIAGNOSIS — D0461 Carcinoma in situ of skin of right upper limb, including shoulder: Secondary | ICD-10-CM | POA: Diagnosis not present

## 2021-07-24 DIAGNOSIS — D2261 Melanocytic nevi of right upper limb, including shoulder: Secondary | ICD-10-CM | POA: Diagnosis not present

## 2021-07-24 DIAGNOSIS — D225 Melanocytic nevi of trunk: Secondary | ICD-10-CM | POA: Diagnosis not present

## 2021-07-24 DIAGNOSIS — Z85828 Personal history of other malignant neoplasm of skin: Secondary | ICD-10-CM | POA: Diagnosis not present

## 2021-07-24 DIAGNOSIS — D0462 Carcinoma in situ of skin of left upper limb, including shoulder: Secondary | ICD-10-CM | POA: Diagnosis not present

## 2021-08-21 DIAGNOSIS — M0579 Rheumatoid arthritis with rheumatoid factor of multiple sites without organ or systems involvement: Secondary | ICD-10-CM | POA: Diagnosis not present

## 2021-08-30 DIAGNOSIS — C44629 Squamous cell carcinoma of skin of left upper limb, including shoulder: Secondary | ICD-10-CM | POA: Diagnosis not present

## 2021-09-13 DIAGNOSIS — D0461 Carcinoma in situ of skin of right upper limb, including shoulder: Secondary | ICD-10-CM | POA: Diagnosis not present

## 2021-10-16 DIAGNOSIS — M0579 Rheumatoid arthritis with rheumatoid factor of multiple sites without organ or systems involvement: Secondary | ICD-10-CM | POA: Diagnosis not present

## 2021-10-16 DIAGNOSIS — Z79899 Other long term (current) drug therapy: Secondary | ICD-10-CM | POA: Diagnosis not present

## 2021-10-16 DIAGNOSIS — K5190 Ulcerative colitis, unspecified, without complications: Secondary | ICD-10-CM | POA: Diagnosis not present

## 2021-10-16 DIAGNOSIS — M810 Age-related osteoporosis without current pathological fracture: Secondary | ICD-10-CM | POA: Diagnosis not present

## 2021-10-16 DIAGNOSIS — R0609 Other forms of dyspnea: Secondary | ICD-10-CM | POA: Diagnosis not present

## 2021-11-28 DIAGNOSIS — J849 Interstitial pulmonary disease, unspecified: Secondary | ICD-10-CM | POA: Diagnosis not present

## 2021-11-28 DIAGNOSIS — M0579 Rheumatoid arthritis with rheumatoid factor of multiple sites without organ or systems involvement: Secondary | ICD-10-CM | POA: Diagnosis not present

## 2021-11-28 DIAGNOSIS — R0609 Other forms of dyspnea: Secondary | ICD-10-CM | POA: Diagnosis not present

## 2021-12-11 DIAGNOSIS — M0579 Rheumatoid arthritis with rheumatoid factor of multiple sites without organ or systems involvement: Secondary | ICD-10-CM | POA: Diagnosis not present

## 2021-12-12 DIAGNOSIS — Z01818 Encounter for other preprocedural examination: Secondary | ICD-10-CM | POA: Diagnosis not present

## 2021-12-12 DIAGNOSIS — J410 Simple chronic bronchitis: Secondary | ICD-10-CM | POA: Diagnosis not present

## 2021-12-28 DIAGNOSIS — M0579 Rheumatoid arthritis with rheumatoid factor of multiple sites without organ or systems involvement: Secondary | ICD-10-CM | POA: Diagnosis not present

## 2021-12-28 DIAGNOSIS — J410 Simple chronic bronchitis: Secondary | ICD-10-CM | POA: Diagnosis not present

## 2021-12-28 DIAGNOSIS — Z0000 Encounter for general adult medical examination without abnormal findings: Secondary | ICD-10-CM | POA: Diagnosis not present

## 2021-12-28 DIAGNOSIS — N1831 Chronic kidney disease, stage 3a: Secondary | ICD-10-CM | POA: Diagnosis not present

## 2021-12-28 DIAGNOSIS — K5130 Ulcerative (chronic) rectosigmoiditis without complications: Secondary | ICD-10-CM | POA: Diagnosis not present

## 2021-12-28 DIAGNOSIS — E7800 Pure hypercholesterolemia, unspecified: Secondary | ICD-10-CM | POA: Diagnosis not present

## 2022-01-22 DIAGNOSIS — Z85828 Personal history of other malignant neoplasm of skin: Secondary | ICD-10-CM | POA: Diagnosis not present

## 2022-01-22 DIAGNOSIS — D485 Neoplasm of uncertain behavior of skin: Secondary | ICD-10-CM | POA: Diagnosis not present

## 2022-01-22 DIAGNOSIS — C44722 Squamous cell carcinoma of skin of right lower limb, including hip: Secondary | ICD-10-CM | POA: Diagnosis not present

## 2022-01-22 DIAGNOSIS — D2271 Melanocytic nevi of right lower limb, including hip: Secondary | ICD-10-CM | POA: Diagnosis not present

## 2022-01-22 DIAGNOSIS — L538 Other specified erythematous conditions: Secondary | ICD-10-CM | POA: Diagnosis not present

## 2022-01-22 DIAGNOSIS — D2261 Melanocytic nevi of right upper limb, including shoulder: Secondary | ICD-10-CM | POA: Diagnosis not present

## 2022-01-22 DIAGNOSIS — L820 Inflamed seborrheic keratosis: Secondary | ICD-10-CM | POA: Diagnosis not present

## 2022-01-22 DIAGNOSIS — D2262 Melanocytic nevi of left upper limb, including shoulder: Secondary | ICD-10-CM | POA: Diagnosis not present

## 2022-01-22 DIAGNOSIS — L570 Actinic keratosis: Secondary | ICD-10-CM | POA: Diagnosis not present

## 2022-01-22 DIAGNOSIS — X32XXXA Exposure to sunlight, initial encounter: Secondary | ICD-10-CM | POA: Diagnosis not present

## 2022-02-05 DIAGNOSIS — J410 Simple chronic bronchitis: Secondary | ICD-10-CM | POA: Diagnosis not present

## 2022-02-05 DIAGNOSIS — Z125 Encounter for screening for malignant neoplasm of prostate: Secondary | ICD-10-CM | POA: Diagnosis not present

## 2022-02-05 DIAGNOSIS — N1831 Chronic kidney disease, stage 3a: Secondary | ICD-10-CM | POA: Diagnosis not present

## 2022-02-05 DIAGNOSIS — E7800 Pure hypercholesterolemia, unspecified: Secondary | ICD-10-CM | POA: Diagnosis not present

## 2022-02-05 DIAGNOSIS — Z0000 Encounter for general adult medical examination without abnormal findings: Secondary | ICD-10-CM | POA: Diagnosis not present

## 2022-02-05 DIAGNOSIS — M0579 Rheumatoid arthritis with rheumatoid factor of multiple sites without organ or systems involvement: Secondary | ICD-10-CM | POA: Diagnosis not present

## 2022-04-05 DIAGNOSIS — M0579 Rheumatoid arthritis with rheumatoid factor of multiple sites without organ or systems involvement: Secondary | ICD-10-CM | POA: Diagnosis not present

## 2022-04-24 DIAGNOSIS — C44722 Squamous cell carcinoma of skin of right lower limb, including hip: Secondary | ICD-10-CM | POA: Diagnosis not present

## 2022-04-24 DIAGNOSIS — L905 Scar conditions and fibrosis of skin: Secondary | ICD-10-CM | POA: Diagnosis not present

## 2022-05-30 DIAGNOSIS — Z7960 Long term (current) use of unspecified immunomodulators and immunosuppressants: Secondary | ICD-10-CM | POA: Diagnosis not present

## 2022-05-30 DIAGNOSIS — L409 Psoriasis, unspecified: Secondary | ICD-10-CM | POA: Diagnosis not present

## 2022-05-30 DIAGNOSIS — J849 Interstitial pulmonary disease, unspecified: Secondary | ICD-10-CM | POA: Diagnosis not present

## 2022-05-30 DIAGNOSIS — M0579 Rheumatoid arthritis with rheumatoid factor of multiple sites without organ or systems involvement: Secondary | ICD-10-CM | POA: Diagnosis not present

## 2022-05-30 DIAGNOSIS — K5130 Ulcerative (chronic) rectosigmoiditis without complications: Secondary | ICD-10-CM | POA: Diagnosis not present

## 2022-05-31 DIAGNOSIS — R040 Epistaxis: Secondary | ICD-10-CM | POA: Diagnosis not present

## 2022-07-11 DIAGNOSIS — M0579 Rheumatoid arthritis with rheumatoid factor of multiple sites without organ or systems involvement: Secondary | ICD-10-CM | POA: Diagnosis not present

## 2022-07-20 DIAGNOSIS — Z79899 Other long term (current) drug therapy: Secondary | ICD-10-CM | POA: Diagnosis not present

## 2022-07-23 DIAGNOSIS — C44622 Squamous cell carcinoma of skin of right upper limb, including shoulder: Secondary | ICD-10-CM | POA: Diagnosis not present

## 2022-07-23 DIAGNOSIS — D485 Neoplasm of uncertain behavior of skin: Secondary | ICD-10-CM | POA: Diagnosis not present

## 2022-07-23 DIAGNOSIS — D2261 Melanocytic nevi of right upper limb, including shoulder: Secondary | ICD-10-CM | POA: Diagnosis not present

## 2022-07-23 DIAGNOSIS — Z85828 Personal history of other malignant neoplasm of skin: Secondary | ICD-10-CM | POA: Diagnosis not present

## 2022-07-23 DIAGNOSIS — X32XXXA Exposure to sunlight, initial encounter: Secondary | ICD-10-CM | POA: Diagnosis not present

## 2022-07-23 DIAGNOSIS — L570 Actinic keratosis: Secondary | ICD-10-CM | POA: Diagnosis not present

## 2022-07-23 DIAGNOSIS — D225 Melanocytic nevi of trunk: Secondary | ICD-10-CM | POA: Diagnosis not present

## 2022-07-23 DIAGNOSIS — C44722 Squamous cell carcinoma of skin of right lower limb, including hip: Secondary | ICD-10-CM | POA: Diagnosis not present

## 2022-07-23 DIAGNOSIS — D2262 Melanocytic nevi of left upper limb, including shoulder: Secondary | ICD-10-CM | POA: Diagnosis not present

## 2022-07-25 ENCOUNTER — Ambulatory Visit: Payer: PPO | Admitting: Podiatry

## 2022-07-25 ENCOUNTER — Encounter: Payer: Self-pay | Admitting: Podiatry

## 2022-07-25 ENCOUNTER — Ambulatory Visit (INDEPENDENT_AMBULATORY_CARE_PROVIDER_SITE_OTHER): Payer: PPO

## 2022-07-25 DIAGNOSIS — R2681 Unsteadiness on feet: Secondary | ICD-10-CM | POA: Diagnosis not present

## 2022-07-25 DIAGNOSIS — R2689 Other abnormalities of gait and mobility: Secondary | ICD-10-CM

## 2022-07-25 DIAGNOSIS — M25579 Pain in unspecified ankle and joints of unspecified foot: Secondary | ICD-10-CM | POA: Insufficient documentation

## 2022-07-25 DIAGNOSIS — M778 Other enthesopathies, not elsewhere classified: Secondary | ICD-10-CM | POA: Diagnosis not present

## 2022-07-25 DIAGNOSIS — S82839A Other fracture of upper and lower end of unspecified fibula, initial encounter for closed fracture: Secondary | ICD-10-CM | POA: Insufficient documentation

## 2022-07-25 NOTE — Progress Notes (Signed)
Subjective:  Patient ID: QUINTON VOTH, male    DOB: [birth year],  MRN: [id number] HPI Chief Complaint  Patient presents with   Toe Pain    Toes bilateral - numbness x years, noticed its worsening, thought maybe arthritis or neuropathy, walks for exercise daily, does have RA, affecting his balance   New Patient (Initial Visit)    76 y.o. male presents with the above complaint.   ROS: Denies fever chills nausea vomit muscle aches pains calf pain back pain chest pain shortness of breath.  Past Medical History:  Diagnosis Date   Arthritis    methotrexate, prednisone, positive rheumatoid factor and anti CCP antibiodies   Chronic ulcerative colitis with complication (HCC)    Chronic ulcerative pancolitis with complication (HCC)    Colitis    HOH (hard of hearing)    AID   Psoriasis    Sleep apnea    H/O SURGERY   Stroke [facility])    H/O TIA   Vertigo    OCCAS   Past Surgical History:  Procedure Laterality Date   CATARACT EXTRACTION W/PHACO Left 07/22/2018   Procedure: CATARACT EXTRACTION PHACO AND INTRAOCULAR LENS PLACEMENT ([place]);  Surgeon: Birder Robson, MD;  Location: [facility] ORS;  Service: Ophthalmology;  Laterality: Left;  [place] 00:43 AP% 15.0 CDE 6.43 Fluid pack lot # [id number]   CATARACT EXTRACTION W/PHACO Right 08/05/2018   Procedure: CATARACT EXTRACTION PHACO AND INTRAOCULAR LENS PLACEMENT (IOC);  Surgeon: Birder Robson, MD;  Location: [facility] ORS;  Service: Ophthalmology;  Laterality: Right;  [place] 00:48 AP% 15.8 CDE 7.64 fluid pack lot # [id number]   COLONOSCOPY WITH PROPOFOL N/A 12/20/2017   Procedure: COLONOSCOPY WITH PROPOFOL;  Surgeon: Manya Silvas, MD;  Location: [facility] ENDOSCOPY;  Service: Endoscopy;  Laterality: N/A;   TONSILLECTOMY     UVULOPALATOPHARYNGOPLASTY      Current Outpatient Medications:    Omega-3 Fatty Acids (FISH OIL PO), Take by mouth., Disp: , Rfl:    Probiotic Product (PROBIOTIC DAILY PO), Take by mouth., Disp: , Rfl:    TURMERIC PO, Take by  mouth., Disp: , Rfl:    calcium gluconate 500 MG tablet, Take 1 tablet by mouth daily., Disp: , Rfl:    Cholecalciferol 2000 units TABS, Take 2,000 Units by mouth daily. , Disp: , Rfl:    folic acid (FOLVITE) 1 MG tablet, Take 1 mg by mouth daily., Disp: , Rfl:    hydroxychloroquine (PLAQUENIL) 200 MG tablet, Take 200 mg by mouth daily., Disp: , Rfl:    inFLIXimab-dyyb in sodium chloride 0.9 %, Inject into the vein every 8 (eight) weeks., Disp: , Rfl:    magnesium oxide (MAG-OX) 400 MG tablet, Take 400 mg by mouth daily., Disp: , Rfl:    methotrexate (RHEUMATREX) 2.5 MG tablet, Take 20 mg by mouth once a week. Saturday, Disp: , Rfl:   Allergies  Allergen Reactions   Amoxicillin-Pot Clavulanate Hives   Celebrex [Celecoxib] Rash   Lobster [Shellfish Allergy] Nausea And Vomiting   Sulfa Antibiotics Rash   Review of Systems Objective:  There were no vitals filed for this visit.  General: Well developed, nourished, in no acute distress, alert and oriented x3   Dermatological: Skin is warm, dry and supple bilateral. Nails x 10 are well maintained; remaining integument appears unremarkable at this time. There are no open sores, no preulcerative lesions, no rash or signs of infection present.  Vascular: Dorsalis Pedis artery and Posterior Tibial artery pedal pulses are 2/4 bilateral with immedate capillary fill time.  Pedal hair growth present. No varicosities and no lower extremity edema present bilateral.   Neruologic: Grossly intact via light touch bilateral. Vibratory intact via tuning fork bilateral. Protective threshold with Semmes Wienstein monofilament intact to all pedal sites bilateral. Patellar and Achilles deep tendon reflexes 2+ bilateral. No Babinski or clonus noted bilateral.   Musculoskeletal: No gross boney pedal deformities bilateral. No pain, crepitus, or limitation noted with foot and ankle range of motion bilateral. Muscular strength 5/5 in all groups tested  bilateral.  Gait: Unassisted, though his gait does seem to be short and cadence and slightly unbalanced.  It does however appear to be nonantalgic.    Radiographs:  Radiographs taken today do not demonstrate any type of osseous abnormalities other than some mild osteopenia.  No acute findings noted.  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  Plan: At this point we discussed the use of Nervive over-the-counter medication.  We also explained to him that there is really nothing that can be done for this forefoot neuropathy.  I am going to send him to gait training and balance training as [facility].     Isamar Wellbrock T. [place], [place]

## 2022-08-29 DIAGNOSIS — M0579 Rheumatoid arthritis with rheumatoid factor of multiple sites without organ or systems involvement: Secondary | ICD-10-CM | POA: Diagnosis not present

## 2022-09-05 DIAGNOSIS — C44722 Squamous cell carcinoma of skin of right lower limb, including hip: Secondary | ICD-10-CM | POA: Diagnosis not present

## 2022-09-05 DIAGNOSIS — C44622 Squamous cell carcinoma of skin of right upper limb, including shoulder: Secondary | ICD-10-CM | POA: Diagnosis not present

## 2022-10-10 DIAGNOSIS — A310 Pulmonary mycobacterial infection: Secondary | ICD-10-CM | POA: Diagnosis not present

## 2022-10-10 DIAGNOSIS — J101 Influenza due to other identified influenza virus with other respiratory manifestations: Secondary | ICD-10-CM | POA: Diagnosis not present

## 2022-10-24 DIAGNOSIS — M0579 Rheumatoid arthritis with rheumatoid factor of multiple sites without organ or systems involvement: Secondary | ICD-10-CM | POA: Diagnosis not present

## 2022-10-24 DIAGNOSIS — M79671 Pain in right foot: Secondary | ICD-10-CM | POA: Diagnosis not present

## 2022-10-24 DIAGNOSIS — M19072 Primary osteoarthritis, left ankle and foot: Secondary | ICD-10-CM | POA: Diagnosis not present

## 2022-10-24 DIAGNOSIS — L409 Psoriasis, unspecified: Secondary | ICD-10-CM | POA: Diagnosis not present

## 2022-10-24 DIAGNOSIS — Z7960 Long term (current) use of unspecified immunomodulators and immunosuppressants: Secondary | ICD-10-CM | POA: Diagnosis not present

## 2022-10-24 DIAGNOSIS — K5130 Ulcerative (chronic) rectosigmoiditis without complications: Secondary | ICD-10-CM | POA: Diagnosis not present

## 2023-10-30 ENCOUNTER — Other Ambulatory Visit: Payer: Self-pay | Admitting: Pulmonary Disease

## 2023-10-30 DIAGNOSIS — J849 Interstitial pulmonary disease, unspecified: Secondary | ICD-10-CM

## 2023-11-12 ENCOUNTER — Ambulatory Visit
Admission: RE | Admit: 2023-11-12 | Discharge: 2023-11-12 | Disposition: A | Discharge status: Home / Self Care | Payer: Medicare HMO | Source: Ambulatory Visit | Attending: Pulmonary Disease | Admitting: Pulmonary Disease

## 2023-11-12 DIAGNOSIS — J849 Interstitial pulmonary disease, unspecified: Secondary | ICD-10-CM | POA: Insufficient documentation

## 2023-12-10 ENCOUNTER — Ambulatory Visit: Payer: Medicare Other | Attending: Orthopedic Surgery | Admitting: Occupational Therapy

## 2023-12-10 ENCOUNTER — Encounter: Payer: Self-pay | Admitting: Occupational Therapy

## 2023-12-10 DIAGNOSIS — M25641 Stiffness of right hand, not elsewhere classified: Secondary | ICD-10-CM | POA: Diagnosis present

## 2023-12-10 DIAGNOSIS — M79641 Pain in right hand: Secondary | ICD-10-CM | POA: Insufficient documentation

## 2023-12-10 NOTE — Therapy (Signed)
 OUTPATIENT OCCUPATIONAL THERAPY ORTHO EVALUATION  Patient Name: Michael Benjamin MRN: [id number] DOB:Jun 22, 1946, 78 y.o., male Today's Date: 12/10/2023  PCP: Dr Lenon MART PROVIDER: Dr Kathlynn  END OF SESSION:  OT End of Session - 12/10/23 1116     Visit Number 1    Number of Visits 4    Date for OT Re-Evaluation 01/21/24    OT Start Time 0902    OT Stop Time 0947    OT Time Calculation (min) 45 min    Activity Tolerance Patient tolerated treatment well    Behavior During Therapy WFL for tasks assessed/performed             Past Medical History:  Diagnosis Date   Arthritis    methotrexate, prednisone, positive rheumatoid factor and anti CCP antibiodies   Chronic ulcerative colitis with complication (HCC)    Chronic ulcerative pancolitis with complication (HCC)    Colitis    HOH (hard of hearing)    AID   Psoriasis    Sleep apnea    H/O SURGERY   Stroke (HCC)    H/O TIA   Vertigo    OCCAS   Past Surgical History:  Procedure Laterality Date   CATARACT EXTRACTION W/PHACO Left 07/22/2018   Procedure: CATARACT EXTRACTION PHACO AND INTRAOCULAR LENS PLACEMENT (IOC);  Surgeon: Jaye Fallow, MD;  Location: [facility] ORS;  Service: Ophthalmology;  Laterality: Left;  US  00:43 AP% 15.0 CDE 6.43 Fluid pack lot # [id number] H   CATARACT EXTRACTION W/PHACO Right 08/05/2018   Procedure: CATARACT EXTRACTION PHACO AND INTRAOCULAR LENS PLACEMENT (IOC);  Surgeon: Jaye Fallow, MD;  Location: [facility] ORS;  Service: Ophthalmology;  Laterality: Right;  US  00:48 AP% 15.8 CDE 7.64 fluid pack lot # [id number]   COLONOSCOPY WITH PROPOFOL  N/A 12/20/2017   Procedure: COLONOSCOPY WITH PROPOFOL ;  Surgeon: Viktoria Lamar DASEN, MD;  Location: [facility] ENDOSCOPY;  Service: Endoscopy;  Laterality: N/A;   TONSILLECTOMY     UVULOPALATOPHARYNGOPLASTY     Patient Active Problem List   Diagnosis Date Noted   Closed fracture of distal fibula 07/25/2022   Pain in joint involving ankle and foot 07/25/2022    Long-term use of immunosuppressant medication 05/30/2022   Psoriasis 05/30/2022   Stage 3a chronic kidney disease (HCC) 12/27/2020   Simple chronic bronchitis (HCC) 12/06/2016   Health care maintenance 07/19/2016   Ulcerative (chronic) proctosigmoiditis, 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) 06/05/2016   Pure hypercholesterolemia 07/06/2015   Rheumatoid arthritis involving multiple sites with positive rheumatoid factor (HCC) 07/06/2015    ONSET DATE: 5-6 yrs gradually  REFERRING DIAG: 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 Pt accompanied by: self  PERTINENT HISTORY:  Ortho visit 12/01/22 :  1. Rheumatoid arthritis with right ulnar drift. The patient has been experiencing right hand pain with swelling and ulnar drift due to rheumatoid arthritis. Splinting was recommended to improve extension of the MCP joints. He will see hand therapist Deland du Kirke, OTR/L, CLT at [facility] on [facility] for a splint to wear at night to hold the fingers up and over. If functionality is insufficient after 3 months of splinting, a referral to hand surgeon at [facility]  for potential centralization of the extensor hood on the 2nd through 5th digits could be considered.  2. Rheumatoid arthritis. The patient has had rheumatoid arthritis for approximately 10 years, leading to ulnar drift and tendon displacement in the right hand. He is currently on methotrexate once a week and hydroxychloroquine. He has reduced his methotrexate dosage from 8 pills a week to 4 pills a week without noticing any difference in symptoms. He is advised to continue his  current medication regimen and monitor for any changes in symptoms.   PRECAUTIONS: None      WEIGHT BEARING RESTRICTIONS: No  PAIN:  Are you having pain? No except if trying to straighten my fingers and pushing on them  FALLS: Has patient fallen in last 6 months? No  LIVING ENVIRONMENT: Lives with: lives with their spouse    PLOF: Play golf about 2-3 times a week , read the news on the iPad, help a little bit around the house.  PATIENT GOALS: I just want to prevent my hand from getting worse  NEXT MD VISIT: See after I have been using the splint for a while to follow-up   OBJECTIVE:  Note: Objective measures were completed at Evaluation unless otherwise noted.  HAND DOMINANCE: Right  ADLs: WFL  -Had to modify his grip with his golf clubs and a hard time with pick up small objects or buttons   UPPER EXTREMITY ROM:    Active range of motion for shoulder elbow and wrist within normal limits no complaints reported   Active ROM Right eval Left eval  Thumb MCP (0-60)    Thumb IP (0-80)    Thumb Radial abd/add (0-55) WFL    Thumb Palmar abd/add (0-45) WFL    Thumb Opposition to Small Finger Base of fifth finger    Index MCP (0-90) -60 ext    Index PIP (0-100)     Index DIP (0-70)      Long MCP (0-90) -70  ext    Long PIP (0-100)      Long DIP (0-70)      Ring MCP (0-90) -60  ext    Ring PIP (0-100)      Ring DIP (0-70)      Little MCP (0-90) -90  ext    Little PIP (0-100)      Little DIP (0-70)      (Blank rows = not tested) Flexion of digits within normal limits- able to make a composite fist   HAND FUNCTION: Grip strength: Right: 48 lbs; Left: 60 lbs, Lateral pinch: Right: 11 lbs, Left: 17 lbs, and 3 point pinch: Right: 10 lbs, Left: 13 lbs  COORDINATION: Harder time with precision pinch -picking up small objects or doing buttons  SENSATION: No sensation issues reported EDEMA: None noticed in session but patient do report wearing compression gloves at  nighttime and having some swelling at times  COGNITION: Overall cognitive status: Within functional limits for tasks assessed       TREATMENT DATE:09/08/24  Modalities: Paraffin:  Time: 8 Location: Right hand Prior to digits extension stretch and fabrication of volar hand-based extension splint for nighttime use       PATIENT EDUCATION: Education details: findings of eval and HEP  Person educated: Patient Education method: Explanation, Demonstration, Tactile cues, Verbal cues, and Handouts Education comprehension: verbalized understanding, returned demonstration, verbal cues required, and needs further education    GOALS: Goals reviewed with patient? Yes   LONG TERM GOALS: Target date: 6 wks  Patient to be independent in splint wearing and home program to increase passive range of motion to right  MC digits extension pain-free Baseline: Patient arrive with digit extension of the metacarpals -62-90 degrees of extension with increased pain with attempts of passive range of motion-observe patient keeping hand in a fist when resting or talking-no knowledge of home program Goal status: INITIAL  2.  Patient's active range of motion in digits  MC extension improved with more than 10 degrees for patient to be able to put hand in pocket and don gloves with more ease Baseline: Active range of motion of digit extension -60 to -90 degrees at metacarpals having a hard time with putting hand in pocket or donning gloves   ASSESSMENT:  CLINICAL IMPRESSION: Patient presented OT evaluation today with a diagnosis of nontraumatic subluxation of extensor tendons at metacarpals of the right dominant hand.  Patient does have a history of rheumatoid arthritis.  Patient is on methotrexate as well as dialysis.  Patient report noticing it about 5 to 6 years but gradually got worse  increasing difficulty using his right dominant hand.  Patient unable to extend metacarpals.  Active range of motion for [facility] extension is -62-90 degrees.  Patient has no knowledge of home program.  With increased pain with attempts of passive range of motion for digit extension.  Patient was educated in a home program for range of motion and soft tissue as well as fabricated a volar hand-based extension splint to use at nighttime.  Patient can benefit from skilled OT services to modify and assess for appropriate splint use as well as education of home program for soft tissue and range of motion. PERFORMANCE DEFICITS: in functional skills including ADLs, IADLs, ROM, pain, flexibility, and UE functional use,   and psychosocial skills including environmental adaptation and routines and behaviors.   IMPAIRMENTS: are limiting patient from ADLs, IADLs, play, and leisure.   COMORBIDITIES: may have co-morbidities  that affects occupational performance. Patient will benefit from skilled OT to address above impairments and improve overall function.  MODIFICATION OR ASSISTANCE TO COMPLETE EVALUATION: Min-Moderate modification of tasks or assist with assess necessary to complete an evaluation.  OT OCCUPATIONAL PROFILE AND HISTORY: Problem focused assessment: Including review of records relating to presenting problem.  CLINICAL DECISION MAKING: LOW - limited treatment options, no task modification necessary  REHAB POTENTIAL: Fair rheumatoid arthritis diagnosis with subluxation of extensor tendon and ulnar drift  EVALUATION COMPLEXITY: Low      PLAN:  OT FREQUENCY: 2-3 visits  OT DURATION: 6 weeks  PLANNED INTERVENTIONS: 97535 self care/ADL training, 02889 therapeutic exercise, 97140 manual therapy, 97018 paraffin, 02965 contrast bath, 97760 Splinting (initial encounter), passive range of motion, patient/family education, and DME and/or AE instructions     CONSULTED AND AGREED WITH PLAN OF CARE:  Patient     Ancel Peters, OTR/L,CLT 12/10/2023, 11:18 AM

## 2023-12-19 ENCOUNTER — Ambulatory Visit: Payer: Medicare Other | Admitting: Occupational Therapy

## 2023-12-19 DIAGNOSIS — M25641 Stiffness of right hand, not elsewhere classified: Secondary | ICD-10-CM | POA: Diagnosis not present

## 2023-12-19 DIAGNOSIS — M79641 Pain in right hand: Secondary | ICD-10-CM

## 2023-12-19 NOTE — Therapy (Signed)
OUTPATIENT OCCUPATIONAL THERAPY ORTHO TREATMENT  Patient Name: Michael Benjamin MRN: [id number] DOB:[birth year], 78 y.o., male Today's Date: 12/19/2023  PCP: Dr Delano Metz PROVIDER: Dr Rosita Kea  END OF SESSION:  OT End of Session - 12/19/23 1931     Visit Number 2    Number of Visits 4    Date for OT Re-Evaluation 01/21/24    OT Start Time 1408    OT Stop Time 1457    OT Time Calculation (min) 49 min    Activity Tolerance Patient tolerated treatment well    Behavior During Therapy WFL for tasks assessed/performed             Past Medical History:  Diagnosis Date   Arthritis    methotrexate, prednisone, positive rheumatoid factor and anti CCP antibiodies   Chronic ulcerative colitis with complication (HCC)    Chronic ulcerative pancolitis with complication (HCC)    Colitis    HOH (hard of hearing)    AID   Psoriasis    Sleep apnea    H/O SURGERY   Stroke (HCC)    H/O TIA   Vertigo    OCCAS   Past Surgical History:  Procedure Laterality Date   CATARACT EXTRACTION W/PHACO Left 07/22/2018   Procedure: CATARACT EXTRACTION PHACO AND INTRAOCULAR LENS PLACEMENT (IOC);  Surgeon: Galen Manila, MD;  Location: [facility] ORS;  Service: Ophthalmology;  Laterality: Left;  [place] 00:43 AP% 15.0 CDE 6.43 Fluid pack lot # [id number]   CATARACT EXTRACTION W/PHACO Right 08/05/2018   Procedure: CATARACT EXTRACTION PHACO AND INTRAOCULAR LENS PLACEMENT (IOC);  Surgeon: Galen Manila, MD;  Location: [facility] ORS;  Service: Ophthalmology;  Laterality: Right;  [place] 00:48 AP% 15.8 CDE 7.64 fluid pack lot # [id number]   COLONOSCOPY WITH PROPOFOL N/A 12/20/2017   Procedure: COLONOSCOPY WITH PROPOFOL;  Surgeon: Scot Jun, MD;  Location: [facility] ENDOSCOPY;  Service: Endoscopy;  Laterality: N/A;   TONSILLECTOMY     UVULOPALATOPHARYNGOPLASTY     Patient Active Problem List   Diagnosis Date Noted   Closed fracture of distal fibula 07/25/2022   Pain in joint involving ankle and foot 07/25/2022    Long-term use of immunosuppressant medication 05/30/2022   Psoriasis 05/30/2022   Stage 3a chronic kidney disease (HCC) 12/27/2020   Simple chronic bronchitis (HCC) 12/06/2016   Health care maintenance 07/19/2016   Ulcerative (chronic) proctosigmoiditis, 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  Wearing my brace at nighttime.  I can tell opening my fingers is little better.  The splint does feel a little soft that it gives a little bit. Pt accompanied by: self  PERTINENT HISTORY:  Ortho visit 12/01/22 :  1. Rheumatoid arthritis with right ulnar drift. The patient has been experiencing right hand pain with swelling and ulnar drift due to rheumatoid arthritis. Splinting was recommended to improve extension of the MCP joints. He will see hand therapist Marisue Humble du Daun Peacock, OTR/L, CLT at [facility] on [facility] for a splint to wear at night to hold the fingers up and over. If functionality  is insufficient after 3 months of splinting, a referral to hand surgeon at [facility] for potential centralization of the extensor hood on the 2nd through 5th digits could be considered.  2. Rheumatoid arthritis. The patient has had rheumatoid arthritis for approximately 10 years, leading to ulnar drift and tendon displacement in the right hand. He is currently on methotrexate once a week and hydroxychloroquine. He has reduced his methotrexate dosage from 8 pills a week to 4 pills a week without noticing  any difference in symptoms. He is advised to continue his current medication regimen and monitor for any changes in symptoms.   PRECAUTIONS: None      WEIGHT BEARING RESTRICTIONS: No  PAIN:  Are you having pain? No except if trying to straighten my fingers and pushing on them  FALLS: Has patient fallen in last 6 months? No  LIVING ENVIRONMENT: Lives with: lives with their spouse    PLOF: Play golf about 2-3 times a week , read the news on the iPad, help a little bit around the house.  PATIENT GOALS: I just want to prevent my hand from getting worse  NEXT MD VISIT: See after I have been using the splint for a while to follow-up   OBJECTIVE:  Note: Objective measures were completed at Evaluation unless otherwise noted.  HAND DOMINANCE: Right  ADLs: WFL  -Had to modify his grip with his golf clubs and a hard time with pick up small objects or buttons   UPPER EXTREMITY ROM:    Active range of motion for shoulder elbow and wrist within normal limits no complaints reported   Active ROM Right eval Left eval R 12/19/23  Thumb MCP (0-60)     Thumb IP (0-80)     Thumb Radial abd/add (0-55) WFL     Thumb Palmar abd/add (0-45) WFL     Thumb Opposition to Small Finger Base of fifth finger     Index MCP (0-90) -60 ext   -50  Index PIP (0-100)      Index DIP (0-70)       Long MCP (0-90) -70  ext   -70 pain with PROM extention  Long PIP (0-100)       Long DIP (0-70)       Ring MCP (0-90) -60  ext   -55  Ring PIP (0-100)       Ring DIP (0-70)       Little MCP (0-90) -90  ext   -90  Little PIP (0-100)       Little DIP (0-70)       (Blank rows = not tested) Flexion of digits within normal limits- able to make a composite fist   HAND FUNCTION: Grip strength: Right: 48 lbs; Left: 60 lbs, Lateral pinch: Right: 11 lbs, Left: 17 lbs, and 3 point pinch: Right: 10 lbs, Left: 13 lbs  COORDINATION: Harder time with precision pinch -picking up small objects or doing  buttons  SENSATION: No sensation issues reported EDEMA: None noticed in session but patient do report wearing compression gloves at nighttime and having some swelling at times  COGNITION: Overall cognitive status: Within functional limits for tasks assessed       TREATMENT DATE:12/19/23      Patient arrived for first follow-up.  Patient need modifications to hand-based extension splint.  Need some reinforcement to not flex because of force of finger  flexion.  Modalities: Paraffin:  Time: 8 Location: Right hand Prior to digits extension stretch prior to modification to enforce  volar hand-based extension splint for nighttime use Soft tissue massage done on palmar hand using Graston tool brushing techniques over digits and palm in combination with digit extension with gentle joint to metacarpals.  Passive range of motion for second fourth and fifth to 0 degrees.  Third digit tightest and with increased pain at passive range of motion at metacarpal.  Modified hand-based extension splint with a volar piece from DIP to proximal palm Reinforced with a wider Velcro strap posterior to metacarpals Close cell phone 1 cm provide for patient to put in splint under digits for slight extension stretch to increase. Patient to wear for about 2 to 3 weeks and return for reassessment Did order patient on the ulnar deviation splint to possible use during the day.  That will provide him some gripping mostly out of PIPs.       PATIENT EDUCATION: Education details: findings of eval and HEP  Person educated: Patient Education method: Explanation, Demonstration, Tactile cues, Verbal cues, and Handouts Education comprehension: verbalized understanding, returned demonstration, verbal cues required, and needs further education    GOALS: Goals reviewed with patient? Yes   LONG TERM  GOALS: Target date: 6 wks  Patient to be independent in splint wearing and home program to increase passive range of motion to right  MC digits extension pain-free Baseline: Patient arrive with digit extension of the metacarpals -62-90 degrees of extension with increased pain with attempts of passive range of motion-observe patient keeping hand in a fist when resting or talking-no knowledge of home program Goal status: INITIAL  2.  Patient's active range of motion in digits  MC extension improved with more than 10 degrees for patient to be able to put hand in pocket and don gloves with more ease Baseline: Active range of motion of digit extension -60 to -90 degrees at metacarpals having a hard time with putting hand in pocket or donning gloves   ASSESSMENT:  CLINICAL IMPRESSION: Patient presented OT evaluation today with a diagnosis of nontraumatic subluxation of extensor tendons at metacarpals of the right dominant hand.  Patient does have a history of rheumatoid arthritis.  Patient is on methotrexate as well as dialysis.  Patient report noticing it about 5 to 6 years but gradually got worse increasing difficulty using his right dominant hand.  Patient unable to extend metacarpals.  Active range of motion for [facility] extension is -62-90 degrees.  NOW pt came in needed some reinforcement to maintain extention -and did show increase ext at 2nd and 4th [facility] - pt to continue at home with home program and follow-up in 2 to 3 weeks.  With increased pain with attempts of passive range of motion for digit extension -mostly 3rd MC.  .  Patient can benefit from skilled OT services to modify and assess for appropriate splint use as well as education of home program for soft tissue and range of motion. PERFORMANCE DEFICITS: in functional skills including ADLs, IADLs, ROM, pain, flexibility, and UE functional use,   and psychosocial skills including environmental adaptation and routines and behaviors.   IMPAIRMENTS:  are limiting patient from ADLs, IADLs, play, and leisure.   COMORBIDITIES: may have co-morbidities  that affects occupational performance. Patient will benefit from skilled OT to address above impairments and improve overall function.  MODIFICATION OR ASSISTANCE TO COMPLETE EVALUATION: Min-Moderate modification of tasks or assist with assess necessary to complete an  evaluation.  OT OCCUPATIONAL PROFILE AND HISTORY: Problem focused assessment: Including review of records relating to presenting problem.  CLINICAL DECISION MAKING: LOW - limited treatment options, no task modification necessary  REHAB POTENTIAL: Fair rheumatoid arthritis diagnosis with subluxation of extensor tendon and ulnar drift  EVALUATION COMPLEXITY: Low      PLAN:  OT FREQUENCY: 2-3 visits  OT DURATION: 6 weeks  PLANNED INTERVENTIONS: 97535 self care/ADL training, 78295 therapeutic exercise, 97140 manual therapy, 97018 paraffin, 62130 contrast bath, 97760 Splinting (initial encounter), passive range of motion, patient/family education, and DME and/or AE instructions     CONSULTED AND AGREED WITH PLAN OF CARE: Patient     Oletta Cohn, OTR/L,CLT 12/19/2023, 7:33 PM

## 2023-12-20 ENCOUNTER — Ambulatory Visit: Payer: Medicare Other | Admitting: Occupational Therapy

## 2024-03-03 ENCOUNTER — Ambulatory Visit: Attending: Orthopedic Surgery | Admitting: Occupational Therapy

## 2024-03-03 DIAGNOSIS — M25641 Stiffness of right hand, not elsewhere classified: Secondary | ICD-10-CM | POA: Diagnosis present

## 2024-03-03 DIAGNOSIS — M79641 Pain in right hand: Secondary | ICD-10-CM | POA: Diagnosis present

## 2024-03-03 NOTE — Therapy (Signed)
 OUTPATIENT OCCUPATIONAL THERAPY ORTHO TREATMENT/RECERT  Patient Name: Michael Benjamin MRN: [id number] DOB:14-Jul-1946, 78 y.o., male Today's Date: 03/03/2024  PCP: Dr Delano Metz PROVIDER: Dr Rosita Kea  END OF SESSION:  OT End of Session - 03/03/24 1518     Visit Number 3    Number of Visits 5    Date for OT Re-Evaluation 04/28/24    OT Start Time 1455    OT Stop Time 1519    OT Time Calculation (min) 24 min    Activity Tolerance Patient tolerated treatment well    Behavior During Therapy WFL for tasks assessed/performed             Past Medical History:  Diagnosis Date   Arthritis    methotrexate, prednisone, positive rheumatoid factor and anti CCP antibiodies   Chronic ulcerative colitis with complication (HCC)    Chronic ulcerative pancolitis with complication (HCC)    Colitis    HOH (hard of hearing)    AID   Psoriasis    Sleep apnea    H/O SURGERY   Stroke (HCC)    H/O TIA   Vertigo    OCCAS   Past Surgical History:  Procedure Laterality Date   CATARACT EXTRACTION W/PHACO Left 07/22/2018   Procedure: CATARACT EXTRACTION PHACO AND INTRAOCULAR LENS PLACEMENT (IOC);  Surgeon: Galen Manila, MD;  Location: [facility] ORS;  Service: Ophthalmology;  Laterality: Left;  [place] 00:43 AP% 15.0 CDE 6.43 Fluid pack lot # [id number]   CATARACT EXTRACTION W/PHACO Right 08/05/2018   Procedure: CATARACT EXTRACTION PHACO AND INTRAOCULAR LENS PLACEMENT (IOC);  Surgeon: Galen Manila, MD;  Location: [facility] ORS;  Service: Ophthalmology;  Laterality: Right;  [place] 00:48 AP% 15.8 CDE 7.64 fluid pack lot # [id number]   COLONOSCOPY WITH PROPOFOL N/A 12/20/2017   Procedure: COLONOSCOPY WITH PROPOFOL;  Surgeon: Scot Jun, MD;  Location: [facility] ENDOSCOPY;  Service: Endoscopy;  Laterality: N/A;   TONSILLECTOMY     UVULOPALATOPHARYNGOPLASTY     Patient Active Problem List   Diagnosis Date Noted   Closed fracture of distal fibula 07/25/2022   Pain in joint involving ankle and foot  07/25/2022   Long-term use of immunosuppressant medication 05/30/2022   Psoriasis 05/30/2022   Stage 3a chronic kidney disease (HCC) 12/27/2020   Simple chronic bronchitis (HCC) 12/06/2016   Health care maintenance 07/19/2016   Ulcerative (chronic) proctosigmoiditis, 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  Since then been wearing that for what couple of months my fingers is more open.  Able to grasp and release things better Pt accompanied by: self  PERTINENT HISTORY:  Ortho visit 12/01/22 :  1. Rheumatoid arthritis with right ulnar drift. The patient has been experiencing right hand pain with swelling and ulnar drift due to rheumatoid arthritis. Splinting was recommended to improve extension of the MCP joints. He will see hand therapist Marisue Humble du Daun Peacock, OTR/L, CLT at [facility] on [facility] for a splint to wear at night to hold the fingers up and over. If functionality is insufficient after 3 months of splinting, a referral to hand surgeon at  [facility] for potential centralization of the extensor hood on the 2nd through 5th digits could be considered.  2. Rheumatoid arthritis. The patient has had rheumatoid arthritis for approximately 10 years, leading to ulnar drift and tendon displacement in the right hand. He is currently on methotrexate once a week and hydroxychloroquine. He has reduced his methotrexate dosage from 8 pills a week to 4 pills a week without noticing any difference in symptoms. He is advised to  continue his current medication regimen and monitor for any changes in symptoms.   PRECAUTIONS: None      WEIGHT BEARING RESTRICTIONS: No  PAIN:  Are you having pain? No except if trying to straighten my fingers and pushing on them  FALLS: Has patient fallen in last 6 months? No  LIVING ENVIRONMENT: Lives with: lives with their spouse    PLOF: Play golf about 2-3 times a week , read the news on the iPad, help a little bit around the house.  PATIENT GOALS: I just want to prevent my hand from getting worse  NEXT MD VISIT: See after I have been using the splint for a while to follow-up   OBJECTIVE:  Note: Objective measures were completed at Evaluation unless otherwise noted.  HAND DOMINANCE: Right  ADLs: WFL  -Had to modify his grip with his golf clubs and a hard time with pick up small objects or buttons   UPPER EXTREMITY ROM:    Active range of motion for shoulder elbow and wrist within normal limits no complaints reported   Active ROM Right eval Left eval R 12/19/23 R 03/03/24  Thumb MCP (0-60)      Thumb IP (0-80)      Thumb Radial abd/add (0-55) WFL      Thumb Palmar abd/add (0-45) WFL      Thumb Opposition to Small Finger Base of fifth finger      Index MCP (0-90) -60 ext   -50 -50  Index PIP (0-100)       Index DIP (0-70)        Long MCP (0-90) -70  ext   -70 pain with PROM extention -60  Long PIP (0-100)        Long DIP (0-70)        Ring MCP (0-90) -60  ext   -55 -50  Ring PIP (0-100)        Ring DIP (0-70)        Little MCP (0-90) -90  ext   -90 -75  Little PIP (0-100)        Little DIP (0-70)        (Blank rows = not tested) Flexion of digits within normal limits- able to make a composite fist   HAND FUNCTION: Grip strength: Right: 48 lbs; Left: 60 lbs, Lateral pinch: Right: 11 lbs, Left: 17 lbs, and 3 point pinch: Right: 10 lbs, Left: 13 lbs  COORDINATION: Harder time with precision pinch -picking up small objects or doing  buttons  SENSATION: No sensation issues reported EDEMA: None noticed in session but patient do report wearing compression gloves at nighttime and having some swelling at times  COGNITION: Overall cognitive status: Within functional limits for tasks assessed       TREATMENT DATE:03/03/24      Patient follow-up today after using night extension splint.  Patient continues to show progress in digit extension.  Compared to start of care.  See flowsheet. Patient fitted today with a anteulnar deviation splint that supports more  MC extension with full composite PIP/DIP flexion. Upon assessment patient able to fit with a medium right and ulnar deviation splint.  Patient able to grasp and release computer mouse 4 cm objects, using clam that requires 3 point pinch.  Patient able to push and pull heavy door as well as carry and lift 4 to 8 pounds simulating pouring a drink with no issues.  Patient educated in donning and doffing as well as wearing. Patient to wear 30 minutes at a time several times during the day during functional tasks like laundry or driving or assisting around the house to facilitate more digit extension in the releasing of objects.  With splint the patient had increased MCP extension.                                                                                                                   Reinforced with patient again to gradually increase time of antiulnar deviation splint wearing during the day for functional strengthening of digit extension.   Patient to continue to wear nighttime hand-based digit extension splint fitted in the past or fabricated. Patient also educated in digit extension of table for placing hold for 4th and 5th.  Able to do 8 reps.     PATIENT EDUCATION: Education details: findings of eval and HEP  Person educated: Patient Education method: Explanation, Demonstration, Tactile cues, Verbal cues, and Handouts Education comprehension: verbalized  understanding, returned demonstration, verbal cues required, and needs further education    GOALS: Goals reviewed with patient? Yes   LONG TERM GOALS: Target date: 6 wks  Patient to be independent in splint wearing and home program to increase passive range of motion to right  MC digits extension pain-free Baseline: Patient arrive with digit extension of the metacarpals -62-90 degrees of extension with increased pain with attempts of passive range of motion-observe patient keeping hand in a fist when resting or talking-no knowledge of home program Goal status: Met  2.  Patient's active range of motion in digits  MC extension improved with more than 10 degrees for patient to be able to put hand in pocket and don gloves with more ease Baseline: Active range of motion of digit extension -60 to -90 degrees at metacarpals having a hard time with putting hand in pocket or donning gloves NOW patient show increase MC extension passive and actively.  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  Patient does have a history of rheumatoid arthritis.  Patient is on methotrexate as well as dialysis.  Patient report noticing it about 5 to 6 years but gradually got worse increasing difficulty using his right dominant hand.  Patient unable to extend metacarpals.  Active range of motion for [facility] extension is -62-90 degrees at the evaluation.  NOW patient since last visit has been wearing a hand-based digit extension splint at nighttime.  To facilitate increased  digit extension.  With great progress from start of care.  Patient has been wearing that for about 8 weeks.  Patient show increased passive and active digit extension.  Able to place and hold 4th and 5th digit near full extension.  Patient was fitted today with a on the ulnar deviation splint to be used  during the day to facilitate functional digit extension while grasping and performing ADLs and IADLs.  Patient showed great progress with MC extension in splint.  Reinforced with patient to gradually increase wearing time of until observation splint to decrease skin irritation in webspaces.  Patient verbalized understanding.  Patient to continue to use splints for about 2 to 3 weeks and follow-up..  Patient can benefit from skilled OT services to modify and assess for appropriate splint use as well as education of home program for soft tissue and range of motion. PERFORMANCE DEFICITS: in functional skills including ADLs, IADLs, ROM, pain, flexibility, and UE functional use,   and psychosocial skills including environmental adaptation and routines and behaviors.   IMPAIRMENTS: are limiting patient from ADLs, IADLs, play, and leisure.   COMORBIDITIES: may have co-morbidities  that affects occupational performance. Patient will benefit from skilled OT to address above impairments and improve overall function.  MODIFICATION OR ASSISTANCE TO COMPLETE EVALUATION: Min-Moderate modification of tasks or assist with assess necessary to complete an evaluation.  OT OCCUPATIONAL PROFILE AND HISTORY: Problem focused assessment: Including review of records relating to presenting problem.  CLINICAL DECISION MAKING: LOW - limited treatment options, no task modification necessary  REHAB POTENTIAL: Fair rheumatoid arthritis diagnosis with subluxation of extensor tendon and ulnar drift  EVALUATION COMPLEXITY: Low      PLAN:  OT FREQUENCY: 2-3 visits  OT DURATION: 8 wks  PLANNED INTERVENTIONS: 97535 self care/ADL training, 16109 therapeutic exercise, 97140 manual therapy, 97018 paraffin, 60454 contrast bath, 97760 Splinting (initial encounter), passive range of motion, patient/family education, and DME and/or AE instructions     CONSULTED AND AGREED WITH PLAN OF CARE: Patient     Oletta Cohn,  OTR/L,CLT 03/03/2024, 3:19 PM

## 2024-03-23 ENCOUNTER — Ambulatory Visit: Admitting: Occupational Therapy

## 2024-03-23 DIAGNOSIS — M25641 Stiffness of right hand, not elsewhere classified: Secondary | ICD-10-CM

## 2024-03-23 DIAGNOSIS — M79641 Pain in right hand: Secondary | ICD-10-CM

## 2024-03-23 NOTE — Therapy (Signed)
 OUTPATIENT OCCUPATIONAL THERAPY ORTHO TREATMENT  Patient Name: Michael Benjamin MRN: [id number] DOB:1946-10-23, 78 y.o., male Today's Date: 03/23/2024  PCP: Dr Abel Hoe PROVIDER: Dr Mozell Arias  END OF SESSION:  OT End of Session - 03/23/24 1641     Visit Number 4    Number of Visits 5    Date for OT Re-Evaluation 04/28/24    OT Start Time 1435    OT Stop Time 1515    OT Time Calculation (min) 40 min    Activity Tolerance Patient tolerated treatment well    Behavior During Therapy WFL for tasks assessed/performed             Past Medical History:  Diagnosis Date   Arthritis    methotrexate, prednisone, positive rheumatoid factor and anti CCP antibiodies   Chronic ulcerative colitis with complication (HCC)    Chronic ulcerative pancolitis with complication (HCC)    Colitis    HOH (hard of hearing)    AID   Psoriasis    Sleep apnea    H/O SURGERY   Stroke (HCC)    H/O TIA   Vertigo    OCCAS   Past Surgical History:  Procedure Laterality Date   CATARACT EXTRACTION W/PHACO Left 07/22/2018   Procedure: CATARACT EXTRACTION PHACO AND INTRAOCULAR LENS PLACEMENT (IOC);  Surgeon: Clair Crews, MD;  Location: [facility] ORS;  Service: Ophthalmology;  Laterality: Left;  US  00:43 AP% 15.0 CDE 6.43 Fluid pack lot # [id number]   CATARACT EXTRACTION W/PHACO Right 08/05/2018   Procedure: CATARACT EXTRACTION PHACO AND INTRAOCULAR LENS PLACEMENT (IOC);  Surgeon: Clair Crews, MD;  Location: [facility] ORS;  Service: Ophthalmology;  Laterality: Right;  US  00:48 AP% 15.8 CDE 7.64 fluid pack lot # [id number]   COLONOSCOPY WITH PROPOFOL  N/A 12/20/2017   Procedure: COLONOSCOPY WITH PROPOFOL ;  Surgeon: Cassie Click, MD;  Location: [facility] ENDOSCOPY;  Service: Endoscopy;  Laterality: N/A;   TONSILLECTOMY     UVULOPALATOPHARYNGOPLASTY     Patient Active Problem List   Diagnosis Date Noted   Closed fracture of distal fibula 07/25/2022   Pain in joint involving ankle and foot 07/25/2022    Long-term use of immunosuppressant medication 05/30/2022   Psoriasis 05/30/2022   Stage 3a chronic kidney disease (HCC) 12/27/2020   Simple chronic bronchitis (HCC) 12/06/2016   Health care maintenance 07/19/2016   Ulcerative (chronic) proctosigmoiditis, 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  I need more stretching.  The other daytime ones doing okay.  My fingers is more open.  I can put my hand in my pocket and put gloves on wash my face.  Put the door open.   Pt accompanied by: self  PERTINENT HISTORY:  Ortho visit 12/01/22 :  1. Rheumatoid arthritis with right ulnar drift. The patient has been experiencing right hand pain with swelling and ulnar drift due to rheumatoid arthritis. Splinting was recommended to improve extension of the MCP joints. He will see hand therapist Evone Hoh du Gorman Laughter, OTR/L, CLT at [facility] on [facility] for a splint to wear at night to hold  the fingers up and over. If functionality is insufficient after 3 months of splinting, a referral to hand surgeon at [facility] for potential centralization of the extensor hood on the 2nd through 5th digits could be considered.  2. Rheumatoid arthritis. The patient has had rheumatoid arthritis for approximately 10 years, leading to ulnar drift and tendon displacement in the right hand. He is currently on methotrexate once a week and hydroxychloroquine. He has reduced his methotrexate dosage from 8 pills a week to 4 pills a week without  noticing any difference in symptoms. He is advised to continue his current medication regimen and monitor for any changes in symptoms.   PRECAUTIONS: None      WEIGHT BEARING RESTRICTIONS: No  PAIN:  Are you having pain? No except if trying to straighten my fingers and pushing on them  FALLS: Has patient fallen in last 6 months? No  LIVING ENVIRONMENT: Lives with: lives with their spouse    PLOF: Play golf about 2-3 times a week , read the news on the iPad, help a little bit around the house.  PATIENT GOALS: I just want to prevent my hand from getting worse  NEXT MD VISIT: See after I have been using the splint for a while to follow-up   OBJECTIVE:  Note: Objective measures were completed at Evaluation unless otherwise noted.  HAND DOMINANCE: Right  ADLs: WFL  -Had to modify his grip with his golf clubs and a hard time with pick up small objects or buttons   UPPER EXTREMITY ROM:    Active range of motion for shoulder elbow and wrist within normal limits no complaints reported   Active ROM Right eval Left eval R 12/19/23 R 03/03/24  Thumb MCP (0-60)      Thumb IP (0-80)      Thumb Radial abd/add (0-55) WFL      Thumb Palmar abd/add (0-45) WFL      Thumb Opposition to Small Finger Base of fifth finger      Index MCP (0-90) -60 ext   -50 -50  Index PIP (0-100)       Index DIP (0-70)        Long MCP (0-90) -70  ext   -70 pain with PROM extention -60  Long PIP (0-100)        Long DIP (0-70)        Ring MCP (0-90) -60  ext   -55 -50  Ring PIP (0-100)        Ring DIP (0-70)        Little MCP (0-90) -90  ext   -90 -75  Little PIP (0-100)        Little DIP (0-70)        (Blank rows = not tested) Flexion of digits within normal limits- able to make a composite fist   HAND FUNCTION: Grip strength: Right: 48 lbs; Left: 60 lbs, Lateral pinch: Right: 11 lbs, Left: 17 lbs, and 3 point pinch: Right: 10 lbs, Left: 13 lbs 03/23/24 Grip strength: Right: 50 lbs; Left: 60  lbs, Lateral pinch: Right: 11 lbs, Left: 17 lbs, and 3 point pinch: Right: 10 lbs, Left: 13 lbs  COORDINATION: Harder time with precision pinch -picking up small objects or doing buttons at eval - now able to pick up small objects better do buttons and sips  SENSATION: No sensation issues reported EDEMA: None noticed in session but patient do report wearing compression gloves at nighttime and having some swelling at  times  COGNITION: Overall cognitive status: Within functional limits for tasks assessed       TREATMENT DATE:03/23/24      Patient f brings in night extension splint to be adjusted..  Patient continues to maintain digits extension.  Compared to start of care.  See flowsheet. This date refabricated and molded patient's right hand extension splint.  Fabricated a dorsal hand-based splint at about -20-30 MC see extension. Patient to continue to wear during the day medium right anti ulnar deviation splint.  Patient can grasp and release computer mouse 4 cm objects, using clam that requires 3 point pinch.  Patient able to push and pull heavy door as well as carry and lift 4 to 8 pounds simulating pouring a drink with no issues.  Patient reporting tolerating daytime splint well Patient continues to maintain extension of digits With increased functional use as well as grip strength Do rolling over right roller for soft tissue massage to the volar hand in extension stretch prior to                                                                                                                add digit extension placed on hold with a rubber band for strengthening to maintain extension      PATIENT EDUCATION: Education details: findings of eval and HEP  Person educated: Patient Education method: Explanation, Demonstration, Tactile cues, Verbal cues, and Handouts Education comprehension: verbalized understanding, returned demonstration, verbal cues required, and needs further  education    GOALS: Goals reviewed with patient? Yes   LONG TERM GOALS: Target date: 6 wks  Patient to be independent in splint wearing and home program to increase passive range of motion to right  MC digits extension pain-free Baseline: Patient arrive with digit extension of the metacarpals -62-90 degrees of extension with increased pain with attempts of passive range of motion-observe patient keeping hand in a fist when resting or talking-no knowledge of home program Goal status: Met  2.  Patient's active range of motion in digits  MC extension improved with more than 10 degrees for patient to be able to put hand in pocket and don gloves with more ease Baseline: Active range of motion of digit extension -60 to -90 degrees at metacarpals having a hard time with putting hand in pocket or donning gloves NOW patient show increase MC extension passive and actively.  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  Patient does have a history of rheumatoid arthritis.  Patient is on methotrexate as well as dialysis.  Patient report noticing it about 5 to 6 years but gradually got worse increasing difficulty using his right dominant hand.  Patient unable to extend metacarpals.  Active range of motion for [facility] extension is -62-90 degrees at the evaluation.  NOW patient brought in a night splint to be adjusted.  -Refabricated for patient to hand-based  digits extension splint but on dorsal side.  Showing great progress and maintaining it -able to add rubber band for digit extension strengthening with a placing hold-continue great progress from start of care.  Patient to continue wearing anti ulnar deviation splint to during the day to facilitate functional digits extension while grasping and performing ADLs and IADLs.  Patient to continue with splints as well  as home program.  Patient can follow-up with me in 3 to 4 weeks.  Otherwise if doing well can call to cancel..  Patient can benefit from skilled OT services to modify and assess for appropriate splint use as well as education of home program for soft tissue and range of motion. PERFORMANCE DEFICITS: in functional skills including ADLs, IADLs, ROM, pain, flexibility, and UE functional use,   and psychosocial skills including environmental adaptation and routines and behaviors.   IMPAIRMENTS: are limiting patient from ADLs, IADLs, play, and leisure.   COMORBIDITIES: may have co-morbidities  that affects occupational performance. Patient will benefit from skilled OT to address above impairments and improve overall function.  MODIFICATION OR ASSISTANCE TO COMPLETE EVALUATION: Min-Moderate modification of tasks or assist with assess necessary to complete an evaluation.  OT OCCUPATIONAL PROFILE AND HISTORY: Problem focused assessment: Including review of records relating to presenting problem.  CLINICAL DECISION MAKING: LOW - limited treatment options, no task modification necessary  REHAB POTENTIAL: Fair rheumatoid arthritis diagnosis with subluxation of extensor tendon and ulnar drift  EVALUATION COMPLEXITY: Low      PLAN:  OT FREQUENCY: 2-3 visits  OT DURATION: 8 wks  PLANNED INTERVENTIONS: 97535 self care/ADL training, 16109 therapeutic exercise, 97140 manual therapy, 97018 paraffin, 60454 contrast bath, 97760 Splinting (initial encounter), passive range of motion, patient/family education, and DME and/or AE instructions     CONSULTED AND AGREED WITH PLAN OF CARE: Patient     Heloise Lobo, OTR/L,CLT 03/23/2024, 4:42 PM
